# Patient Record
Sex: Female | Born: 1985 | Race: Black or African American | Hispanic: No | Marital: Married | Smoking: Never smoker
Health system: Southern US, Community
[De-identification: ages and names within clinical notes are randomized; demographics above are authoritative.]

## PROBLEM LIST (undated history)

## (undated) ENCOUNTER — Inpatient Hospital Stay (HOSPITAL_COMMUNITY): Payer: Self-pay

## (undated) DIAGNOSIS — D573 Sickle-cell trait: Secondary | ICD-10-CM

## (undated) HISTORY — PX: NO PAST SURGERIES: SHX2092

## (undated) HISTORY — PX: OVUM / OOCYTE RETRIEVAL: SUR1269

## (undated) HISTORY — DX: Sickle-cell trait: D57.3

---

## 2014-06-15 ENCOUNTER — Encounter (HOSPITAL_COMMUNITY): Payer: Self-pay | Admitting: *Deleted

## 2014-06-15 ENCOUNTER — Inpatient Hospital Stay (HOSPITAL_COMMUNITY)
Admission: AD | Admit: 2014-06-15 | Discharge: 2014-06-15 | Disposition: A | Discharge status: Home / Self Care | Payer: Self-pay | Source: Ambulatory Visit | Attending: Obstetrics and Gynecology | Admitting: Obstetrics and Gynecology

## 2014-06-15 DIAGNOSIS — R8271 Bacteriuria: Secondary | ICD-10-CM

## 2014-06-15 DIAGNOSIS — N390 Urinary tract infection, site not specified: Secondary | ICD-10-CM | POA: Insufficient documentation

## 2014-06-15 DIAGNOSIS — O99891 Other specified diseases and conditions complicating pregnancy: Secondary | ICD-10-CM | POA: Insufficient documentation

## 2014-06-15 DIAGNOSIS — O219 Vomiting of pregnancy, unspecified: Secondary | ICD-10-CM

## 2014-06-15 DIAGNOSIS — O9989 Other specified diseases and conditions complicating pregnancy, childbirth and the puerperium: Secondary | ICD-10-CM

## 2014-06-15 DIAGNOSIS — O2341 Unspecified infection of urinary tract in pregnancy, first trimester: Secondary | ICD-10-CM

## 2014-06-15 DIAGNOSIS — O210 Mild hyperemesis gravidarum: Secondary | ICD-10-CM | POA: Insufficient documentation

## 2014-06-15 DIAGNOSIS — O2390 Unspecified genitourinary tract infection in pregnancy, unspecified trimester: Secondary | ICD-10-CM | POA: Insufficient documentation

## 2014-06-15 LAB — URINALYSIS, ROUTINE W REFLEX MICROSCOPIC
BILIRUBIN URINE: NEGATIVE
Glucose, UA: NEGATIVE mg/dL
HGB URINE DIPSTICK: NEGATIVE
KETONES UR: NEGATIVE mg/dL
Nitrite: POSITIVE — AB
PH: 6 (ref 5.0–8.0)
Protein, ur: NEGATIVE mg/dL
SPECIFIC GRAVITY, URINE: 1.015 (ref 1.005–1.030)
Urobilinogen, UA: 0.2 mg/dL (ref 0.0–1.0)

## 2014-06-15 LAB — URINE MICROSCOPIC-ADD ON

## 2014-06-15 LAB — POCT PREGNANCY, URINE: Preg Test, Ur: POSITIVE — AB

## 2014-06-15 MED ORDER — PRENATAL VITAMINS PLUS 27-1 MG PO TABS: 1.0000 | ORAL_TABLET | ORAL | Status: AC

## 2014-06-15 MED ORDER — DOXYLAMINE SUCCINATE (SLEEP) 25 MG PO TABS: 25.0000 mg | ORAL_TABLET | Freq: Every evening | ORAL | Status: DC | PRN

## 2014-06-15 MED ORDER — PROMETHAZINE HCL 25 MG PO TABS
12.5000 mg | ORAL_TABLET | Freq: Once | ORAL | Status: AC
  Administered 2014-06-15: 12.5 mg via ORAL
  Filled 2014-06-15: qty 1

## 2014-06-15 MED ORDER — CEPHALEXIN 500 MG PO CAPS: 500.0000 mg | ORAL_CAPSULE | Freq: Four times a day (QID) | ORAL | Status: DC

## 2014-06-15 MED ORDER — PROMETHAZINE HCL 12.5 MG PO TABS: 12.5000 mg | ORAL_TABLET | Freq: Four times a day (QID) | ORAL | Status: DC | PRN

## 2014-06-15 NOTE — MAU Note (Signed)
Pt reports she is pregnant and has had N/V for sevral weeks unable to keep much downl

## 2014-06-15 NOTE — Discharge Instructions (Signed)
Morning Sickness Morning sickness is when you feel sick to your stomach (nauseous) during pregnancy. This nauseous feeling may or may not come with vomiting. It often occurs in the morning but can be a problem any time of day. Morning sickness is most common during the first trimester, but it may continue throughout pregnancy. While morning sickness is unpleasant, it is usually harmless unless you develop severe and continual vomiting (hyperemesis gravidarum). This condition requires more intense treatment.  CAUSES  The cause of morning sickness is not completely known but seems to be related to normal hormonal changes that occur in pregnancy. RISK FACTORS You are at greater risk if you:  Experienced nausea or vomiting before your pregnancy.  Had morning sickness during a previous pregnancy.  Are pregnant with more than one baby, such as twins. TREATMENT  Do not use any medicines (prescription, over-the-counter, or herbal) for morning sickness without first talking to your health care provider. Your health care provider may prescribe or recommend:  Vitamin B6 supplements.  Anti-nausea medicines.  The herbal medicine ginger. HOME CARE INSTRUCTIONS   Only take over-the-counter or prescription medicines as directed by your health care provider.  Taking multivitamins before getting pregnant can prevent or decrease the severity of morning sickness in most women.  Eat a piece of dry toast or unsalted crackers before getting out of bed in the morning.  Eat five or six small meals a day.  Eat dry and bland foods (rice, baked potato). Foods high in carbohydrates are often helpful.  Do not drink liquids with your meals. Drink liquids between meals.  Avoid greasy, fatty, and spicy foods.  Get someone to cook for you if the smell of any food causes nausea and vomiting.  If you feel nauseous after taking prenatal vitamins, take the vitamins at night or with a snack.  Snack on protein  foods (nuts, yogurt, cheese) between meals if you are hungry.  Eat unsweetened gelatins for desserts.  Wearing an acupressure wristband (worn for sea sickness) may be helpful.  Acupuncture may be helpful.  Do not smoke.  Get a humidifier to keep the air in your house free of odors.  Get plenty of fresh air. SEEK MEDICAL CARE IF:   Your home remedies are not working, and you need medicine.  You feel dizzy or lightheaded.  You are losing weight. SEEK IMMEDIATE MEDICAL CARE IF:   You have persistent and uncontrolled nausea and vomiting.  You pass out (faint). MAKE SURE YOU:  Understand these instructions.  Will watch your condition.  Will get help right away if you are not doing well or get worse. Document Released: 12/23/2006 Document Revised: 11/06/2013 Document Reviewed: 04/18/2013 [facility] Patient Information 2015 [place], [place]. This information is not intended to replace advice given to you by your health care provider. Make sure you discuss any questions you have with your health care provider.    ________________________________________     To schedule your Maternity Eligibility Appointment, please call [phone].  When you arrive for your appointment you must bring the following items or information listed below.  Your appointment will be rescheduled if you do not have these items or are 15 minutes late. If currently receiving Medicaid, you MUST bring: 1. Medicaid Card 2. Social Security Card 3. Picture ID 4. Proof of Pregnancy 5. Verification of current address if the address on Medicaid card is incorrect "postmarked mail" If not receiving Medicaid, you MUST bring: 1. Social Security Card 2. Picture ID 3. Birth Certificate (  if available) Passport or *Green Card 4. Proof of Pregnancy 5. Verification of current address "postmarked mail" for each income presented. 6. Verification of insurance coverage, if any 7. Check stubs from each employer for the  previous month (if unable to present check stub  for each week, we will accept check stub for the first and last week ill the same month.) If you can't locate check stubs, you must bring a letter from the employer(s) and it must have the following information on letterhead, typed, in English: o name of [place] telephone number o how long been with the company, if less than one month o how much person earns per hour o how many hours per week work o the gross pay the person earned for the previous month If you are 28 years old or less, you do not have to bring proof of income unless you work or live with the father of the baby and at that time we will need proof of income from you and/or the father of the baby. Green Card recipients are eligible for Medicaid for Pregnant Women (MPW)  Prenatal Care Providers [facility]  [phone]     Phone: [place]                      [facility]  @[place]     Phone: [phone]  Phone: [place]     Phone: [phone]  Phone: [facility] [place]                hone: [phone]  Phone: [phone]         [facility] [address]. Gracy Racer      Phone: [phone]  Phone: [phone]         [place] [facility]                Phone: [phone]  [place])          Phone: [phone] [facility]   Phone: [phone] Asymptomatic Bacteriuria Asymptomatic bacteriuria is the presence of a large number of bacteria in your urine without the usual symptoms of burning or frequent urination. The following conditions increase the risk of asymptomatic bacteriuria:  Diabetes mellitus.  Advanced age.  Pregnancy  in the first trimester.  Kidney stones.  Kidney transplants.  Leaky kidney tube valve in young children (reflux). Treatment for this condition is not needed in most people and can lead to other problems such as too much yeast and growth of resistant bacteria. However, some people, such as pregnant women, do need treatment to prevent kidney infection. Asymptomatic bacteriuria in pregnancy is also associated with fetal growth restriction, premature labor, and newborn death. HOME CARE INSTRUCTIONS Monitor your condition for any changes. The following actions may help to relieve any discomfort you are feeling:  Drink enough water and fluids to keep your urine clear or pale yellow. Go to the bathroom more often to keep your bladder empty.  Keep the area around your vagina and rectum clean. Wipe yourself from front to back after  urinating. SEEK IMMEDIATE MEDICAL CARE IF:  You develop signs of an infection such as:  Burning with urination.  Frequency of voiding.  Back pain.  Fever.  You have blood in the urine.  You develop a fever. MAKE SURE YOU:  Understand these instructions.  Will watch your condition.  Will get help right away if you are not doing well or get worse. Document Released: 11/01/2005 Document Revised: 03/18/2014 Document Reviewed: 04/23/2013 [facility] Patient Information 2015 [place], [place]. This information is not intended to replace advice given to you by your health care provider. Make sure you discuss any questions you have with your health care provider.

## 2014-06-15 NOTE — MAU Provider Note (Signed)
Chief Complaint: Morning Sickness   First Provider Initiated Contact with Patient 06/15/14 1549     SUBJECTIVE HPI: Casey Sullivan is a 28 y.o. G4P0030 at [redacted]w[redacted]d by sure LMP who presents with nausea and vomiting for the past 3-4 weeks. States she vomits every day usually about 2 hours after eating. Unsure of prepregnant weight. Not taking any antiemetics. No dysuria, hematuria, urgency. No irrititative vaginal discharge. No abdominal pain or vaginal bleeding. NPC.  Past Medical History  Diagnosis Date  . Medical history non-contributory    OB History  Gravida Para Term Preterm AB SAB TAB Ectopic Multiple Living  4    3         # Outcome Date GA Lbr Len/2nd Weight Sex Delivery Anes PTL Lv  4 CUR           3 ABT           2 ABT           1 ABT              Past Surgical History  Procedure Laterality Date  . No past surgeries    . Ovum / oocyte retrieval     History   Social History  . Marital Status: Single    Spouse Name: N/A    Number of Children: N/A  . Years of Education: N/A   Occupational History  . Not on file.   Social History Main Topics  . Smoking status: Never Smoker   . Smokeless tobacco: Not on file  . Alcohol Use: Not on file     Comment: occasioanl  . Drug Use: No  . Sexual Activity: Not on file   Other Topics Concern  . Not on file   Social History Narrative  . No narrative on file   No current facility-administered medications on file prior to encounter.   No current outpatient prescriptions on file prior to encounter.   No Known Allergies  ROS: Pertinent items in HPI  OBJECTIVE Blood pressure 130/80, pulse 107, temperature 98.5 F (36.9 C), temperature source Oral, resp. rate 18, height 5' 5.75" (1.67 m), last menstrual period 04/11/2014. GENERAL: Well-developed, well-nourished female in no acute distress.  HEENT: Normocephalic HEART: normal rate RESP: normal effort ABDOMEN: Soft, non-tender, uterus sl above SP BACK: neg  CVAT EXTREMITIES: Nontender, no edema NEURO: Alert and oriented  LAB RESULTS Results for orders placed during the hospital encounter of 06/15/14 (from the past 24 hour(s))  URINALYSIS, ROUTINE W REFLEX MICROSCOPIC     Status: Abnormal   Collection Time    06/15/14  3:25 PM      Result Value Ref Range   Color, Urine YELLOW  YELLOW   APPearance HAZY (*) CLEAR   Specific Gravity, Urine 1.015  1.005 - 1.030   pH 6.0  5.0 - 8.0   Glucose, UA NEGATIVE  NEGATIVE mg/dL   Hgb urine dipstick NEGATIVE  NEGATIVE   Bilirubin Urine NEGATIVE  NEGATIVE   Ketones, ur NEGATIVE  NEGATIVE mg/dL   Protein, ur NEGATIVE  NEGATIVE mg/dL   Urobilinogen, UA 0.2  0.0 - 1.0 mg/dL   Nitrite POSITIVE (*) NEGATIVE   Leukocytes, UA SMALL (*) NEGATIVE  URINE MICROSCOPIC-ADD ON     Status: Abnormal   Collection Time    06/15/14  3:25 PM      Result Value Ref Range   Squamous Epithelial / LPF FEW (*) RARE   WBC, UA 11-20  <3 WBC/hpf   RBC /  HPF 0-2  <3 RBC/hpf   Bacteria, UA MANY (*) RARE  POCT PREGNANCY, URINE     Status: Abnormal   Collection Time    06/15/14  3:27 PM      Result Value Ref Range   Preg Test, Ur POSITIVE (*) NEGATIVE    IMAGING No results found.  MAU COURSE Urine C&S sent  ASSESSMENT 1. Nausea and vomiting during pregnancy prior to [redacted] weeks gestation   2. UTI (urinary tract infection) during pregnancy, first trimester   Asymptomatic bacteruria G4P0030 at [redacted]w[redacted]d  PLAN Discharge home    Medication List         cephALEXin 500 MG capsule  Commonly known as:  KEFLEX  Take 1 capsule (500 mg total) by mouth 4 (four) times daily.     doxylamine (Sleep) 25 MG tablet  Commonly known as:  UNISOM  Take 1 tablet (25 mg total) by mouth at bedtime as needed.     PRENATAL VITAMINS PLUS 27-1 MG Tabs  Take 1 tablet by mouth 1 day or 1 dose.     promethazine 12.5 MG tablet  Commonly known as:  PHENERGAN  Take 1 tablet (12.5 mg total) by mouth every 6 (six) hours as needed for nausea or  vomiting.       Follow-up Information   Follow up with [facility] [place]. (Choose pregnancy care provider from the list given)    Contact information:   [place] [address] [place], CNM 06/15/2014  3:50 PM

## 2014-06-18 NOTE — MAU Provider Note (Signed)
Attestation of Attending Supervision of Advanced Practitioner: Evaluation and management procedures were performed by the PA/NP/CNM/OB Fellow under my supervision/collaboration. Chart reviewed and agree with management and plan.  Venda Dice V 06/18/2014 7:13 AM

## 2014-07-27 ENCOUNTER — Inpatient Hospital Stay (HOSPITAL_COMMUNITY): Payer: Medicaid Other

## 2014-07-27 ENCOUNTER — Encounter (HOSPITAL_COMMUNITY): Payer: Self-pay | Admitting: *Deleted

## 2014-07-27 ENCOUNTER — Inpatient Hospital Stay (HOSPITAL_COMMUNITY)
Admission: AD | Admit: 2014-07-27 | Discharge: 2014-07-27 | Disposition: A | Discharge status: Home / Self Care | Payer: Medicaid Other | Source: Ambulatory Visit | Attending: Obstetrics & Gynecology | Admitting: Obstetrics & Gynecology

## 2014-07-27 DIAGNOSIS — N390 Urinary tract infection, site not specified: Secondary | ICD-10-CM | POA: Insufficient documentation

## 2014-07-27 DIAGNOSIS — O2390 Unspecified genitourinary tract infection in pregnancy, unspecified trimester: Secondary | ICD-10-CM | POA: Insufficient documentation

## 2014-07-27 DIAGNOSIS — O4402 Placenta previa specified as without hemorrhage, second trimester: Secondary | ICD-10-CM

## 2014-07-27 DIAGNOSIS — R109 Unspecified abdominal pain: Secondary | ICD-10-CM | POA: Insufficient documentation

## 2014-07-27 DIAGNOSIS — O209 Hemorrhage in early pregnancy, unspecified: Secondary | ICD-10-CM | POA: Insufficient documentation

## 2014-07-27 DIAGNOSIS — O4400 Placenta previa specified as without hemorrhage, unspecified trimester: Secondary | ICD-10-CM | POA: Insufficient documentation

## 2014-07-27 LAB — CBC
HCT: 31 % — ABNORMAL LOW (ref 36.0–46.0)
HEMOGLOBIN: 11 g/dL — AB (ref 12.0–15.0)
MCH: 26.8 pg (ref 26.0–34.0)
MCHC: 35.5 g/dL (ref 30.0–36.0)
MCV: 75.4 fL — ABNORMAL LOW (ref 78.0–100.0)
PLATELETS: 210 10*3/uL (ref 150–400)
RBC: 4.11 MIL/uL (ref 3.87–5.11)
RDW: 13.4 % (ref 11.5–15.5)
WBC: 5.2 10*3/uL (ref 4.0–10.5)

## 2014-07-27 LAB — URINALYSIS, ROUTINE W REFLEX MICROSCOPIC
Bilirubin Urine: NEGATIVE
Glucose, UA: NEGATIVE mg/dL
Hgb urine dipstick: NEGATIVE
Ketones, ur: NEGATIVE mg/dL
LEUKOCYTES UA: NEGATIVE
NITRITE: NEGATIVE
PROTEIN: NEGATIVE mg/dL
Specific Gravity, Urine: 1.01 (ref 1.005–1.030)
Urobilinogen, UA: 0.2 mg/dL (ref 0.0–1.0)
pH: 6.5 (ref 5.0–8.0)

## 2014-07-27 LAB — OB RESULTS CONSOLE ANTIBODY SCREEN: Antibody Screen: NEGATIVE

## 2014-07-27 LAB — ABO/RH: ABO/RH(D): B POS

## 2014-07-27 MED ORDER — NITROFURANTOIN MONOHYD MACRO 100 MG PO CAPS: 100.0000 mg | ORAL_CAPSULE | Freq: Two times a day (BID) | ORAL | Status: DC

## 2014-07-27 NOTE — MAU Provider Note (Signed)
History     CSN: [id number]  Arrival date and time: 07/27/14 1123   None     Chief Complaint  Patient presents with  . Vaginal Bleeding   HPI  Ms.Casey Sullivan is a 28 y.o. female G4P0030 at [redacted]w[redacted]d who presents with new onset dark red vaginal bleeding along with bilateral lower abdominal pain. The patient denies recent intercourse.She is scheduled for her first prenatal visit on Sept 17th; pt is unsure what the name of the practice/dr is. The patient desribes the bleeding as light, and she did not need to wear a pad.   OB History   Grav Para Term Preterm Abortions TAB SAB Ect Mult Living   4    3           Past Medical History  Diagnosis Date  . Medical history non-contributory     Past Surgical History  Procedure Laterality Date  . No past surgeries    . Ovum / oocyte retrieval      History reviewed. No pertinent family history.  History  Substance Use Topics  . Smoking status: Never Smoker   . Smokeless tobacco: Never Used  . Alcohol Use: Yes     Comment: occasioanl    Allergies: No Known Allergies  Prescriptions prior to admission  Medication Sig Dispense Refill  . Prenatal Vit-Fe Fumarate-FA (PRENATAL VITAMINS PLUS) 27-1 MG TABS Take 1 tablet by mouth 1 day or 1 dose.  30 tablet  3   Results for orders placed during the hospital encounter of 07/27/14 (from the past 48 hour(s))  URINALYSIS, ROUTINE W REFLEX MICROSCOPIC     Status: None   Collection Time    07/27/14 11:35 AM      Result Value Ref Range   Color, Urine YELLOW  YELLOW   APPearance CLEAR  CLEAR   Specific Gravity, Urine 1.010  1.005 - 1.030   pH 6.5  5.0 - 8.0   Glucose, UA NEGATIVE  NEGATIVE mg/dL   Hgb urine dipstick NEGATIVE  NEGATIVE   Bilirubin Urine NEGATIVE  NEGATIVE   Ketones, ur NEGATIVE  NEGATIVE mg/dL   Protein, ur NEGATIVE  NEGATIVE mg/dL   Urobilinogen, UA 0.2  0.0 - 1.0 mg/dL   Nitrite NEGATIVE  NEGATIVE   Leukocytes, UA NEGATIVE  NEGATIVE   Comment: MICROSCOPIC NOT  DONE ON URINES WITH NEGATIVE PROTEIN, BLOOD, LEUKOCYTES, NITRITE, OR GLUCOSE <1000 mg/dL.  ABO/RH     Status: None   Collection Time    07/27/14  1:30 PM      Result Value Ref Range   ABO/RH(D) B POS    CBC     Status: Abnormal   Collection Time    07/27/14  1:30 PM      Result Value Ref Range   WBC 5.2  4.0 - 10.5 K/uL   RBC 4.11  3.87 - 5.11 MIL/uL   Hemoglobin 11.0 (*) 12.0 - 15.0 g/dL   HCT 31.0 (*) 36.0 - 46.0 %   MCV 75.4 (*) 78.0 - 100.0 fL   MCH 26.8  26.0 - 34.0 pg   MCHC 35.5  30.0 - 36.0 g/dL   RDW 13.4  11.5 - 15.5 %   Platelets 210  150 - 400 K/uL    Review of Systems  Constitutional: Negative for fever and chills.  Gastrointestinal: Positive for abdominal pain (Bilateral lower abdominal cramping ). Negative for nausea and vomiting.  Genitourinary:       No vaginal discharge. + vaginal bleeding.  No dysuria.    Physical Exam   Blood pressure 115/74, pulse 82, temperature 98.3 F (36.8 C), temperature source Oral, resp. rate 16, height 5\' 5"  (1.651 m), weight 65.375 kg (144 lb 2 oz), last menstrual period 04/11/2014.  Physical Exam  Constitutional: She is oriented to person, place, and time. She appears well-developed and well-nourished. No distress.  HENT:  Head: Normocephalic.  Eyes: Pupils are equal, round, and reactive to light.  Neck: Neck supple.  Respiratory: Effort normal.  GI: Soft. She exhibits no distension. There is no tenderness. There is no rebound and no guarding.  Genitourinary:  Speculum exam: Vagina - Small amount of thick, dark brown discharge Cervix - No contact bleeding, no active bleeding  Chaperone present for exam.   Musculoskeletal: Normal range of motion.  Neurological: She is alert and oriented to person, place, and time.  Skin: Skin is warm. She is not diaphoretic.  Psychiatric: Her behavior is normal.    MAU Course  Procedures None  MDM Unable to doppler fetal heart tones by RN Patient to [place] to evaluate placenta   Pelvic exam  B positive blood type  Assessment and Plan   Assessment: UTI Vaginal bleeding in second trimester Posterior placenta previa   Plan: Discharge home in stable condition RX: Macrobid Return to MAU if bleeding worsens Bleeding precautions Pelvic rest   [place], NP  07/27/2014, 1:18 PM

## 2014-07-27 NOTE — MAU Provider Note (Signed)
Attestation of Attending Supervision of Advanced Practitioner (PA/CNM/NP): Evaluation and management procedures were performed by the Advanced Practitioner under my supervision and collaboration.  I have reviewed the Advanced Practitioner's note and chart, and I agree with the management and plan.  UGONNA  ANYANWU, MD, FACOG Attending Obstetrician & Gynecologist Faculty Practice, [facility]   

## 2014-07-27 NOTE — MAU Note (Signed)
Patient presents to MAU with complaint of vaginal bleeding at 15 weeks.

## 2014-07-30 LAB — CULTURE, OB URINE
Colony Count: 30000
SPECIAL REQUESTS: NORMAL

## 2014-08-05 ENCOUNTER — Other Ambulatory Visit (HOSPITAL_COMMUNITY): Payer: Self-pay | Admitting: Physician Assistant

## 2014-08-05 DIAGNOSIS — Z3689 Encounter for other specified antenatal screening: Secondary | ICD-10-CM

## 2014-08-05 LAB — OB RESULTS CONSOLE GC/CHLAMYDIA
Chlamydia: NEGATIVE
Gonorrhea: NEGATIVE

## 2014-08-05 LAB — OB RESULTS CONSOLE GBS: STREP GROUP B AG: POSITIVE

## 2014-08-05 LAB — OB RESULTS CONSOLE HIV ANTIBODY (ROUTINE TESTING): HIV: NONREACTIVE

## 2014-08-05 LAB — OB RESULTS CONSOLE HEPATITIS B SURFACE ANTIGEN: Hepatitis B Surface Ag: NEGATIVE

## 2014-08-05 LAB — OB RESULTS CONSOLE RPR: RPR: NONREACTIVE

## 2014-08-05 LAB — OB RESULTS CONSOLE RUBELLA ANTIBODY, IGM: Rubella: IMMUNE

## 2014-09-02 ENCOUNTER — Ambulatory Visit (HOSPITAL_COMMUNITY)
Admission: RE | Admit: 2014-09-02 | Discharge: 2014-09-02 | Disposition: A | Discharge status: Home / Self Care | Payer: Medicaid Other | Source: Ambulatory Visit | Attending: Physician Assistant | Admitting: Physician Assistant

## 2014-09-02 ENCOUNTER — Other Ambulatory Visit (HOSPITAL_COMMUNITY): Payer: Self-pay

## 2014-09-02 DIAGNOSIS — Z3A20 20 weeks gestation of pregnancy: Secondary | ICD-10-CM | POA: Diagnosis not present

## 2014-09-02 DIAGNOSIS — Z36 Encounter for antenatal screening of mother: Secondary | ICD-10-CM | POA: Insufficient documentation

## 2014-09-02 DIAGNOSIS — Z3689 Encounter for other specified antenatal screening: Secondary | ICD-10-CM

## 2014-09-02 DIAGNOSIS — O4402 Placenta previa specified as without hemorrhage, second trimester: Secondary | ICD-10-CM

## 2014-09-04 ENCOUNTER — Other Ambulatory Visit (HOSPITAL_COMMUNITY): Payer: Self-pay | Admitting: Physician Assistant

## 2014-09-04 DIAGNOSIS — O4402 Placenta previa specified as without hemorrhage, second trimester: Secondary | ICD-10-CM

## 2014-09-17 ENCOUNTER — Encounter (HOSPITAL_COMMUNITY): Payer: Self-pay | Admitting: *Deleted

## 2014-10-24 ENCOUNTER — Ambulatory Visit (HOSPITAL_COMMUNITY): Admission: RE | Admit: 2014-10-24 | Payer: Medicaid Other | Source: Ambulatory Visit

## 2014-11-15 NOTE — L&D Delivery Note (Signed)
Patient is 29 y.o. G4P0030 [redacted]w[redacted]d admitted in active labor.   Delivery Note At 9:37 PM a healthy female was delivered via  (Presentation: ; Occiput Anterior).  APGAR: 8,8 ; weight: pending .   Placenta status: Intact, Spontaneous.  Cord:  with the following complications: None.    Anesthesia: Epidural  Episiotomy:  None Lacerations:  None Suture Repair: N/A Est. Blood Loss (mL):  300  Mom to postpartum.  Baby to Couplet care / Skin to Skin.  Lorna Few 01/17/2015, 9:59 PM  I was gloved and present for the entire delivery/inspection and I agree with the above. Serita Grammes CNM 11:29 PM 01/17/2015

## 2014-11-27 ENCOUNTER — Other Ambulatory Visit (HOSPITAL_COMMUNITY): Payer: Self-pay | Admitting: Physician Assistant

## 2014-11-27 DIAGNOSIS — Z0372 Encounter for suspected placental problem ruled out: Secondary | ICD-10-CM

## 2014-11-28 ENCOUNTER — Ambulatory Visit (HOSPITAL_COMMUNITY)
Admission: RE | Admit: 2014-11-28 | Discharge: 2014-11-28 | Disposition: A | Discharge status: Home / Self Care | Payer: Medicaid Other | Source: Ambulatory Visit | Attending: Physician Assistant | Admitting: Physician Assistant

## 2014-11-28 DIAGNOSIS — Z3A32 32 weeks gestation of pregnancy: Secondary | ICD-10-CM | POA: Insufficient documentation

## 2014-11-28 DIAGNOSIS — Z0372 Encounter for suspected placental problem ruled out: Secondary | ICD-10-CM

## 2014-11-28 DIAGNOSIS — O4403 Placenta previa specified as without hemorrhage, third trimester: Secondary | ICD-10-CM | POA: Insufficient documentation

## 2014-11-29 ENCOUNTER — Other Ambulatory Visit (HOSPITAL_COMMUNITY): Payer: Self-pay | Admitting: Nurse Practitioner

## 2014-11-29 DIAGNOSIS — O4410 Placenta previa with hemorrhage, unspecified trimester: Secondary | ICD-10-CM

## 2014-12-19 ENCOUNTER — Ambulatory Visit (HOSPITAL_COMMUNITY)
Admission: RE | Admit: 2014-12-19 | Discharge: 2014-12-19 | Disposition: A | Discharge status: Home / Self Care | Payer: Medicaid Other | Source: Ambulatory Visit | Attending: Nurse Practitioner | Admitting: Nurse Practitioner

## 2014-12-19 DIAGNOSIS — O4440 Low lying placenta NOS or without hemorrhage, unspecified trimester: Secondary | ICD-10-CM | POA: Insufficient documentation

## 2014-12-19 DIAGNOSIS — Z3A35 35 weeks gestation of pregnancy: Secondary | ICD-10-CM | POA: Insufficient documentation

## 2014-12-19 DIAGNOSIS — O4410 Placenta previa with hemorrhage, unspecified trimester: Secondary | ICD-10-CM | POA: Insufficient documentation

## 2015-01-16 ENCOUNTER — Other Ambulatory Visit (HOSPITAL_COMMUNITY): Payer: Self-pay | Admitting: Nurse Practitioner

## 2015-01-16 ENCOUNTER — Encounter (HOSPITAL_COMMUNITY): Payer: Self-pay | Admitting: *Deleted

## 2015-01-16 ENCOUNTER — Inpatient Hospital Stay (HOSPITAL_COMMUNITY)
Admission: AD | Admit: 2015-01-16 | Discharge: 2015-01-16 | Disposition: A | Discharge status: Home / Self Care | Payer: Medicaid Other | Source: Ambulatory Visit | Attending: Obstetrics & Gynecology | Admitting: Obstetrics & Gynecology

## 2015-01-16 ENCOUNTER — Inpatient Hospital Stay (HOSPITAL_COMMUNITY)
Admission: AD | Admit: 2015-01-16 | Discharge: 2015-01-19 | DRG: 774 | Disposition: A | Discharge status: Home / Self Care | Payer: Medicaid Other | Source: Ambulatory Visit | Attending: Obstetrics & Gynecology | Admitting: Obstetrics & Gynecology

## 2015-01-16 DIAGNOSIS — IMO0001 Reserved for inherently not codable concepts without codable children: Secondary | ICD-10-CM

## 2015-01-16 DIAGNOSIS — O26893 Other specified pregnancy related conditions, third trimester: Secondary | ICD-10-CM | POA: Insufficient documentation

## 2015-01-16 DIAGNOSIS — R109 Unspecified abdominal pain: Secondary | ICD-10-CM | POA: Insufficient documentation

## 2015-01-16 DIAGNOSIS — O480 Post-term pregnancy: Secondary | ICD-10-CM | POA: Diagnosis present

## 2015-01-16 DIAGNOSIS — O99824 Streptococcus B carrier state complicating childbirth: Secondary | ICD-10-CM | POA: Diagnosis present

## 2015-01-16 DIAGNOSIS — Z3A40 40 weeks gestation of pregnancy: Secondary | ICD-10-CM | POA: Diagnosis present

## 2015-01-16 DIAGNOSIS — Z23 Encounter for immunization: Secondary | ICD-10-CM

## 2015-01-16 DIAGNOSIS — O4403 Placenta previa specified as without hemorrhage, third trimester: Secondary | ICD-10-CM | POA: Diagnosis present

## 2015-01-16 LAB — COMPREHENSIVE METABOLIC PANEL
ALBUMIN: 3.2 g/dL — AB (ref 3.5–5.2)
ALT: 12 U/L (ref 0–35)
ANION GAP: 10 (ref 5–15)
AST: 17 U/L (ref 0–37)
Alkaline Phosphatase: 144 U/L — ABNORMAL HIGH (ref 39–117)
BUN: 9 mg/dL (ref 6–23)
CALCIUM: 9.7 mg/dL (ref 8.4–10.5)
CO2: 23 mmol/L (ref 19–32)
CREATININE: 0.54 mg/dL (ref 0.50–1.10)
Chloride: 102 mmol/L (ref 96–112)
GFR calc Af Amer: >90 mL/min (ref >90)
GFR calc non Af Amer: >90 mL/min (ref >90)
GLUCOSE: 88 mg/dL (ref 70–99)
POTASSIUM: 3.7 mmol/L (ref 3.5–5.1)
Sodium: 135 mmol/L (ref 135–145)
TOTAL PROTEIN: 7.1 g/dL (ref 6.0–8.3)
Total Bilirubin: 0.5 mg/dL (ref 0.3–1.2)

## 2015-01-16 LAB — CBC
HCT: 34.1 % — ABNORMAL LOW (ref 36.0–46.0)
HEMOGLOBIN: 12 g/dL (ref 12.0–15.0)
MCH: 28.5 pg (ref 26.0–34.0)
MCHC: 35.2 g/dL (ref 30.0–36.0)
MCV: 81 fL (ref 78.0–100.0)
Platelets: 239 10*3/uL (ref 150–400)
RBC: 4.21 MIL/uL (ref 3.87–5.11)
RDW: 14.1 % (ref 11.5–15.5)
WBC: 7.9 10*3/uL (ref 4.0–10.5)

## 2015-01-16 LAB — PROTEIN / CREATININE RATIO, URINE
Creatinine, Urine: 60 mg/dL
Protein Creatinine Ratio: 0.27 — ABNORMAL HIGH (ref 0.00–0.15)
Total Protein, Urine: 16 mg/dL

## 2015-01-16 LAB — URIC ACID: URIC ACID, SERUM: 4.4 mg/dL (ref 2.4–7.0)

## 2015-01-16 LAB — LACTATE DEHYDROGENASE: LDH: 130 U/L (ref 94–250)

## 2015-01-16 MED ORDER — OXYCODONE-ACETAMINOPHEN 5-325 MG PO TABS
1.0000 | ORAL_TABLET | Freq: Once | ORAL | Status: AC
  Administered 2015-01-16: 1 via ORAL
  Filled 2015-01-16: qty 1

## 2015-01-16 MED ORDER — LACTATED RINGERS IV SOLN
INTRAVENOUS | Status: DC
  Administered 2015-01-16 – 2015-01-17 (×2): via INTRAVENOUS

## 2015-01-16 MED ORDER — OXYCODONE-ACETAMINOPHEN 5-325 MG PO TABS: 1.0000 | ORAL_TABLET | Freq: Four times a day (QID) | ORAL | Status: DC | PRN

## 2015-01-16 NOTE — Discharge Instructions (Signed)
Braxton Hicks Contractions °Contractions of the uterus can occur throughout pregnancy. Contractions are not always a sign that you are in labor.  °WHAT ARE BRAXTON HICKS CONTRACTIONS?  °Contractions that occur before labor are called Braxton Hicks contractions, or false labor. Toward the end of pregnancy (32-34 weeks), these contractions can develop more often and may become more forceful. This is not true labor because these contractions do not result in opening (dilatation) and thinning of the cervix. They are sometimes difficult to tell apart from true labor because these contractions can be forceful and people have different pain tolerances. You should not feel embarrassed if you go to the hospital with false labor. Sometimes, the only way to tell if you are in true labor is for your health care provider to look for changes in the cervix. °If there are no prenatal problems or other health problems associated with the pregnancy, it is completely safe to be sent home with false labor and await the onset of true labor. °HOW CAN YOU TELL THE DIFFERENCE BETWEEN TRUE AND FALSE LABOR? °False Labor °· The contractions of false labor are usually shorter and not as hard as those of true labor.   °· The contractions are usually irregular.   °· The contractions are often felt in the front of the lower abdomen and in the groin.   °· The contractions may go away when you walk around or change positions while lying down.   °· The contractions get weaker and are shorter lasting as time goes on.   °· The contractions do not usually become progressively stronger, regular, and closer together as with true labor.   °True Labor °· Contractions in true labor last 30-70 seconds, become very regular, usually become more intense, and increase in frequency.   °· The contractions do not go away with walking.   °· The discomfort is usually felt in the top of the uterus and spreads to the lower abdomen and low back.   °· True labor can be  determined by your health care provider with an exam. This will show that the cervix is dilating and getting thinner.   °WHAT TO REMEMBER °· Keep up with your usual exercises and follow other instructions given by your health care provider.   °· Take medicines as directed by your health care provider.   °· Keep your regular prenatal appointments.   °· Eat and drink lightly if you think you are going into labor.   °· If Braxton Hicks contractions are making you uncomfortable:   °¨ Change your position from lying down or resting to walking, or from walking to resting.   °¨ Sit and rest in a tub of warm water.   °¨ Drink 2-3 glasses of water. Dehydration may cause these contractions.   °¨ Do slow and deep breathing several times an hour.   °WHEN SHOULD I SEEK IMMEDIATE MEDICAL CARE? °Seek immediate medical care if: °· Your contractions become stronger, more regular, and closer together.   °· You have fluid leaking or gushing from your vagina.   °· You have a fever.   °· You pass blood-tinged mucus.   °· You have vaginal bleeding.   °· You have continuous abdominal pain.   °· You have low back pain that you never had before.   °· You feel your baby's head pushing down and causing pelvic pressure.   °· Your baby is not moving as much as it used to.   °Document Released: 11/01/2005 Document Revised: 11/06/2013 Document Reviewed: 08/13/2013 °Exit[employer]® Patient Information ©2015 [employer]. This information is not intended to replace advice given to you by your health care   provider. Make sure you discuss any questions you have with your health care provider. ° °

## 2015-01-16 NOTE — MAU Provider Note (Signed)
  History     CSN: [id number]  Arrival date and time: 01/16/15 0056  Patient Contact at 3:15am on 01/16/15    Chief Complaint  Patient presents with  . Labor Eval   HPI Mrs. Graul is a 29yo female G4P0030 at [place] presenting for cramping since 1800 on 01/15/15 reporting to have contractions every 5 minutes. Pain located in groin. Denies vaginal bleeding or discharge. Denies LOF. Continues to note fetal movement. Denies edema, headache, chest pain, or blurred vision. Has follow up appointment at HD at 2pm today. OB History    Gravida Para Term Preterm AB TAB SAB Ectopic Multiple Living   4    3           Past Medical History  Diagnosis Date  . Medical history non-contributory     Past Surgical History  Procedure Laterality Date  . No past surgeries    . Ovum / oocyte retrieval      History reviewed. No pertinent family history.  History  Substance Use Topics  . Smoking status: Never Smoker   . Smokeless tobacco: Never Used  . Alcohol Use: Yes     Comment: occasioanl    Allergies: No Known Allergies  Prescriptions prior to admission  Medication Sig Dispense Refill Last Dose  . Prenatal Vit-Fe Fumarate-FA (PRENATAL VITAMINS PLUS) 27-1 MG TABS Take 1 tablet by mouth 1 day or 1 dose. 30 tablet 3 01/15/2015 at Unknown time  . nitrofurantoin, macrocrystal-monohydrate, (MACROBID) 100 MG capsule Take 1 capsule (100 mg total) by mouth 2 (two) times daily. 10 capsule 0     Review of Systems  Eyes: Negative for blurred vision.  Cardiovascular: Negative for chest pain.   Physical Exam   Blood pressure 123/82, pulse 87, temperature 98.1 F (36.7 C), temperature source Oral, resp. rate 20, height 5\' 4"  (1.626 m), weight 86.24 kg (190 lb 2 oz), last menstrual period 04/11/2014, SpO2 100 %.  Physical Exam  Constitutional: She appears well-developed and well-nourished.  Moderate distress secondary to contractions  Cardiovascular: Normal rate and regular rhythm.  Exam reveals no  gallop and no friction rub.   No murmur heard. Respiratory: Effort normal. No respiratory distress. She has no wheezes. She has no rales.  GI: Soft. There is no tenderness.  Musculoskeletal: She exhibits no edema.  Pain over round ligament bilaterally  Psychiatric: She has a normal mood and affect. Her behavior is normal.  Dilation: 2 Effacement (%): 80 Station: -2 Presentation: Vertex Exam by:: [place],RN  MAU Course  Procedures  MDM CMP: normal CBC: normal Protein-Creatinine Ratio: 0.27 Fetal Monitor: baseline 140bpm, moderate variability, + accelerations, no decelerations  Assessment and Plan  Cervical exam unchanged over 1 hour. Suspect contractions are [employer]. BP initially elevated at 135/89. Max noted 149/90, trended back down to 123/82 prior to discharge. Protein-Creatinine Ratio mildly elevated at 0.27. Percocet #15 given for pain PRN Stable for discharge. Follow up at HD at 2pm today for recheck of cervical exam and recheck of BP.  Lorna Few 01/16/2015, 3:12 AM   I have participated in the care of this patient and I agree with the above. Serita Grammes CNM 11:30 PM 01/17/2015

## 2015-01-16 NOTE — H&P (Signed)
Casey Sullivan is a 29 y.o. female G4P0030 with IUP at [redacted]w[redacted]d presenting for contractions. She was seen early this am, contracting irregularly without cervical change. .Since then, her contractions have gradually gotten stronger and more regular.   Membranes are intact, with active fetal movement.   PNCare at [facility] since 16 wks  Prenatal History/Complications:  Late PNC GBS + TAB X 3   Past Medical History: Past Medical History  Diagnosis Date  . Medical history non-contributory     Past Surgical History: Past Surgical History  Procedure Laterality Date  . No past surgeries    . Ovum / oocyte retrieval      Obstetrical History: OB History    Gravida Para Term Preterm AB TAB SAB Ectopic Multiple Living   4    3            Social History: History   Social History  . Marital Status: Single    Spouse Name: N/A  . Number of Children: N/A  . Years of Education: N/A   Social History Main Topics  . Smoking status: Never Smoker   . Smokeless tobacco: Never Used  . Alcohol Use: Yes     Comment: occasioanl  . Drug Use: No  . Sexual Activity: Yes    Birth Control/ Protection: None   Other Topics Concern  . Not on file   Social History Narrative    Family History: No family history on file.  Allergies: No Known Allergies  Prescriptions prior to admission  Medication Sig Dispense Refill Last Dose  . nitrofurantoin, macrocrystal-monohydrate, (MACROBID) 100 MG capsule Take 1 capsule (100 mg total) by mouth 2 (two) times daily. 10 capsule 0   . oxyCODONE-acetaminophen (PERCOCET/ROXICET) 5-325 MG per tablet Take 1 tablet by mouth every 6 (six) hours as needed for severe pain. 5 tablet 0   . Prenatal Vit-Fe Fumarate-FA (PRENATAL VITAMINS PLUS) 27-1 MG TABS Take 1 tablet by mouth 1 day or 1 dose. 30 tablet 3 01/15/2015 at Unknown time     Prenatal Transfer Tool  Maternal Diabetes: No Genetic Screening: Normal Maternal Ultrasounds/Referrals: Normal Fetal Ultrasounds  or other Referrals:  None Maternal Substance Abuse:  No Significant Maternal Medications:  None Significant Maternal Lab Results: Lab values include: Group B Strep positive     Review of Systems   Constitutional: Negative for fever and chills Eyes: Negative for visual disturbances Respiratory: Negative for shortness of breath, dyspnea Cardiovascular: Negative for chest pain or palpitations  Gastrointestinal: Negative for vomiting, diarrhea and constipation.  POSITIVE for abdominal pain (contractions) Genitourinary: Negative for dysuria and urgency Musculoskeletal: Negative for back pain, joint pain, myalgias  Neurological: Negative for dizziness and headaches      Blood pressure 124/82, pulse 114, temperature 99.7 F (37.6 C), temperature source Oral, resp. rate 24, last menstrual period 04/11/2014, SpO2 99 %. General appearance: alert, cooperative and mild distress Lungs: clear to auscultation bilaterally Heart: regular rate and rhythm Abdomen: soft, non-tender; bowel sounds normal Extremities: Homans sign is negative, no sign of DVT DTR's 2+ Presentation: cephalic Fetal monitoring  Baseline: 145 bpm, Variability: Good {> 6 bpm), Accelerations: Reactive and Decelerations: Absent Uterine activity  q 2-3 minutes  Dilation: 7 Effacement (%): 90 Station: -2 Exam by:: Noel Gerold RN   Prenatal labs: ABO, Rh: --/--/B POS (09/12 1330) Antibody:  neg Rubella:  imm RPR:   neg HBsAg:   neg HIV:   neg GBS:   pos 1 hr Glucola 126 Genetic screening  Normal AFP  Anatomy US normal   Results for orders placed or performed during the hospital encounter of 01/16/15 (from the past 24 hour(s))  Protein / creatinine ratio, urine   Collection Time: 01/16/15  1:12 AM  Result Value Ref Range   Creatinine, Urine 60.00 mg/dL   Total Protein, Urine 16 mg/dL   Protein Creatinine Ratio 0.27 (H) 0.00 - 0.15  Comprehensive metabolic panel   Collection Time: 01/16/15  2:24 AM  Result  Value Ref Range   Sodium 135 135 - 145 mmol/L   Potassium 3.7 3.5 - 5.1 mmol/L   Chloride 102 96 - 112 mmol/L   CO2 23 19 - 32 mmol/L   Glucose, Bld 88 70 - 99 mg/dL   BUN 9 6 - 23 mg/dL   Creatinine, Ser 0.54 0.50 - 1.10 mg/dL   Calcium 9.7 8.4 - 10.5 mg/dL   Total Protein 7.1 6.0 - 8.3 g/dL   Albumin 3.2 (L) 3.5 - 5.2 g/dL   AST 17 0 - 37 U/L   ALT 12 0 - 35 U/L   Alkaline Phosphatase 144 (H) 39 - 117 U/L   Total Bilirubin 0.5 0.3 - 1.2 mg/dL   GFR calc non Af Amer >90 >90 mL/min   GFR calc Af Amer >90 >90 mL/min   Anion gap 10 5 - 15  CBC   Collection Time: 01/16/15  2:24 AM  Result Value Ref Range   WBC 7.9 4.0 - 10.5 K/uL   RBC 4.21 3.87 - 5.11 MIL/uL   Hemoglobin 12.0 12.0 - 15.0 g/dL   HCT 34.1 (L) 36.0 - 46.0 %   MCV 81.0 78.0 - 100.0 fL   MCH 28.5 26.0 - 34.0 pg   MCHC 35.2 30.0 - 36.0 g/dL   RDW 14.1 11.5 - 15.5 %   Platelets 239 150 - 400 K/uL  Uric acid   Collection Time: 01/16/15  2:24 AM  Result Value Ref Range   Uric Acid, Serum 4.4 2.4 - 7.0 mg/dL  Lactate dehydrogenase   Collection Time: 01/16/15  2:24 AM  Result Value Ref Range   LDH 130 94 - 250 U/L    Assessment: Casey Sullivan is a 29 y.o. G4P0030 with an IUP at [redacted]w[redacted]d presenting for active labor; GBS +  Plan: #Labor: expectant management #Pain:  epidural #FWB Cat1 #ID: GBS: +.  Rx ampicillin d/t advanced cx dilation  #MOF:  br/bo #MOC: unsure #Circ: no   CRESENZO-DISHMAN,Vamsi Apfel 01/16/2015, 11:45 PM

## 2015-01-16 NOTE — MAU Note (Signed)
Pt reports cramping since 1800, now having contractions q 5 minutes. Denies bleeding or ROM

## 2015-01-17 ENCOUNTER — Inpatient Hospital Stay (HOSPITAL_COMMUNITY): Payer: Medicaid Other | Admitting: Anesthesiology

## 2015-01-17 ENCOUNTER — Encounter (HOSPITAL_COMMUNITY): Payer: Self-pay | Admitting: *Deleted

## 2015-01-17 DIAGNOSIS — IMO0001 Reserved for inherently not codable concepts without codable children: Secondary | ICD-10-CM

## 2015-01-17 DIAGNOSIS — O99824 Streptococcus B carrier state complicating childbirth: Secondary | ICD-10-CM

## 2015-01-17 DIAGNOSIS — O4403 Placenta previa specified as without hemorrhage, third trimester: Secondary | ICD-10-CM

## 2015-01-17 DIAGNOSIS — O480 Post-term pregnancy: Secondary | ICD-10-CM

## 2015-01-17 LAB — CBC
HEMATOCRIT: 35.5 % — AB (ref 36.0–46.0)
Hemoglobin: 12.3 g/dL (ref 12.0–15.0)
MCH: 27.8 pg (ref 26.0–34.0)
MCHC: 34.6 g/dL (ref 30.0–36.0)
MCV: 80.1 fL (ref 78.0–100.0)
Platelets: 261 10*3/uL (ref 150–400)
RBC: 4.43 MIL/uL (ref 3.87–5.11)
RDW: 14.1 % (ref 11.5–15.5)
WBC: 13.8 10*3/uL — ABNORMAL HIGH (ref 4.0–10.5)

## 2015-01-17 LAB — TYPE AND SCREEN
ABO/RH(D): B POS
Antibody Screen: NEGATIVE

## 2015-01-17 MED ORDER — WITCH HAZEL-GLYCERIN EX PADS: 1.0000 "application " | MEDICATED_PAD | CUTANEOUS | Status: DC | PRN

## 2015-01-17 MED ORDER — EPHEDRINE 5 MG/ML INJ
10.0000 mg | INTRAVENOUS | Status: DC | PRN
  Filled 2015-01-17: qty 2

## 2015-01-17 MED ORDER — ZOLPIDEM TARTRATE 5 MG PO TABS: 5.0000 mg | ORAL_TABLET | Freq: Every evening | ORAL | Status: DC | PRN

## 2015-01-17 MED ORDER — TETANUS-DIPHTH-ACELL PERTUSSIS 5-2.5-18.5 LF-MCG/0.5 IM SUSP
0.5000 mL | Freq: Once | INTRAMUSCULAR | Status: AC
Start: 2015-01-18 — End: 2015-01-19
  Administered 2015-01-19: 0.5 mL via INTRAMUSCULAR
  Filled 2015-01-17: qty 0.5

## 2015-01-17 MED ORDER — FENTANYL 2.5 MCG/ML BUPIVACAINE 1/10 % EPIDURAL INFUSION (WH - ANES)
14.0000 mL/h | INTRAMUSCULAR | Status: DC | PRN
  Administered 2015-01-17 (×2): 14 mL/h via EPIDURAL

## 2015-01-17 MED ORDER — OXYTOCIN 40 UNITS IN LACTATED RINGERS INFUSION - SIMPLE MED
1.0000 m[IU]/min | INTRAVENOUS | Status: DC
  Administered 2015-01-17: 2 m[IU]/min via INTRAVENOUS
  Filled 2015-01-17: qty 1000

## 2015-01-17 MED ORDER — DIPHENHYDRAMINE HCL 50 MG/ML IJ SOLN: 12.5000 mg | INTRAMUSCULAR | Status: DC | PRN

## 2015-01-17 MED ORDER — TERBUTALINE SULFATE 1 MG/ML IJ SOLN
0.2500 mg | Freq: Once | INTRAMUSCULAR | Status: DC | PRN
  Filled 2015-01-17: qty 1

## 2015-01-17 MED ORDER — PHENYLEPHRINE 40 MCG/ML (10ML) SYRINGE FOR IV PUSH (FOR BLOOD PRESSURE SUPPORT)
80.0000 ug | PREFILLED_SYRINGE | INTRAVENOUS | Status: DC | PRN
  Filled 2015-01-17: qty 2
  Filled 2015-01-17: qty 20

## 2015-01-17 MED ORDER — CITRIC ACID-SODIUM CITRATE 334-500 MG/5ML PO SOLN: 30.0000 mL | ORAL | Status: DC | PRN

## 2015-01-17 MED ORDER — LANOLIN HYDROUS EX OINT
TOPICAL_OINTMENT | CUTANEOUS | Status: DC | PRN
Start: 2015-01-17 — End: 2015-01-19

## 2015-01-17 MED ORDER — OXYCODONE-ACETAMINOPHEN 5-325 MG PO TABS: 1.0000 | ORAL_TABLET | ORAL | Status: DC | PRN

## 2015-01-17 MED ORDER — OXYTOCIN 40 UNITS IN LACTATED RINGERS INFUSION - SIMPLE MED
62.5000 mL/h | INTRAVENOUS | Status: DC
  Administered 2015-01-17: 62.5 mL/h via INTRAVENOUS
  Filled 2015-01-17: qty 1000

## 2015-01-17 MED ORDER — ONDANSETRON HCL 4 MG/2ML IJ SOLN: 4.0000 mg | Freq: Four times a day (QID) | INTRAMUSCULAR | Status: DC | PRN

## 2015-01-17 MED ORDER — LACTATED RINGERS IV SOLN: 500.0000 mL | INTRAVENOUS | Status: DC | PRN

## 2015-01-17 MED ORDER — OXYTOCIN BOLUS FROM INFUSION
500.0000 mL | INTRAVENOUS | Status: DC
  Administered 2015-01-17: 500 mL via INTRAVENOUS

## 2015-01-17 MED ORDER — ACETAMINOPHEN 325 MG PO TABS: 650.0000 mg | ORAL_TABLET | ORAL | Status: DC | PRN

## 2015-01-17 MED ORDER — OXYCODONE-ACETAMINOPHEN 5-325 MG PO TABS: 2.0000 | ORAL_TABLET | ORAL | Status: DC | PRN

## 2015-01-17 MED ORDER — LIDOCAINE HCL (PF) 1 % IJ SOLN
INTRAMUSCULAR | Status: DC | PRN
  Administered 2015-01-17: 6 mL
  Administered 2015-01-17: 4 mL

## 2015-01-17 MED ORDER — SIMETHICONE 80 MG PO CHEW: 80.0000 mg | CHEWABLE_TABLET | ORAL | Status: DC | PRN

## 2015-01-17 MED ORDER — PRENATAL MULTIVITAMIN CH
1.0000 | ORAL_TABLET | Freq: Every day | ORAL | Status: DC
  Administered 2015-01-18 – 2015-01-19 (×2): 1 via ORAL
  Filled 2015-01-17 (×2): qty 1

## 2015-01-17 MED ORDER — SODIUM CHLORIDE 0.9 % IV SOLN
2.0000 g | Freq: Four times a day (QID) | INTRAVENOUS | Status: DC
  Administered 2015-01-17 (×3): 2 g via INTRAVENOUS
  Filled 2015-01-17 (×4): qty 2000

## 2015-01-17 MED ORDER — SENNOSIDES-DOCUSATE SODIUM 8.6-50 MG PO TABS
2.0000 | ORAL_TABLET | ORAL | Status: DC
  Administered 2015-01-18 – 2015-01-19 (×2): 2 via ORAL
  Filled 2015-01-17 (×2): qty 2

## 2015-01-17 MED ORDER — IBUPROFEN 600 MG PO TABS
600.0000 mg | ORAL_TABLET | Freq: Four times a day (QID) | ORAL | Status: DC
  Administered 2015-01-18 – 2015-01-19 (×7): 600 mg via ORAL
  Filled 2015-01-17 (×7): qty 1

## 2015-01-17 MED ORDER — ONDANSETRON HCL 4 MG/2ML IJ SOLN: 4.0000 mg | INTRAMUSCULAR | Status: DC | PRN

## 2015-01-17 MED ORDER — PHENYLEPHRINE 40 MCG/ML (10ML) SYRINGE FOR IV PUSH (FOR BLOOD PRESSURE SUPPORT)
80.0000 ug | PREFILLED_SYRINGE | INTRAVENOUS | Status: DC | PRN
Start: 2015-01-17 — End: 2015-01-17
  Filled 2015-01-17: qty 2

## 2015-01-17 MED ORDER — LACTATED RINGERS IV SOLN
500.0000 mL | Freq: Once | INTRAVENOUS | Status: AC
  Administered 2015-01-17: 500 mL via INTRAVENOUS

## 2015-01-17 MED ORDER — DIBUCAINE 1 % RE OINT: 1.0000 "application " | TOPICAL_OINTMENT | RECTAL | Status: DC | PRN

## 2015-01-17 MED ORDER — DIPHENHYDRAMINE HCL 25 MG PO CAPS: 25.0000 mg | ORAL_CAPSULE | Freq: Four times a day (QID) | ORAL | Status: DC | PRN

## 2015-01-17 MED ORDER — LIDOCAINE HCL (PF) 1 % IJ SOLN
30.0000 mL | INTRAMUSCULAR | Status: DC | PRN
  Filled 2015-01-17: qty 30

## 2015-01-17 MED ORDER — BENZOCAINE-MENTHOL 20-0.5 % EX AERO: 1.0000 "application " | INHALATION_SPRAY | CUTANEOUS | Status: DC | PRN

## 2015-01-17 MED ORDER — ONDANSETRON HCL 4 MG PO TABS: 4.0000 mg | ORAL_TABLET | ORAL | Status: DC | PRN

## 2015-01-17 MED ORDER — SODIUM CHLORIDE 0.9 % IV SOLN
2.0000 g | Freq: Once | INTRAVENOUS | Status: AC
  Administered 2015-01-17: 2 g via INTRAVENOUS
  Filled 2015-01-17: qty 2000

## 2015-01-17 MED ORDER — FENTANYL 2.5 MCG/ML BUPIVACAINE 1/10 % EPIDURAL INFUSION (WH - ANES)
14.0000 mL/h | INTRAMUSCULAR | Status: DC | PRN
  Administered 2015-01-17: 14 mL/h via EPIDURAL
  Filled 2015-01-17 (×3): qty 125

## 2015-01-17 MED ORDER — FLEET ENEMA 7-19 GM/118ML RE ENEM: 1.0000 | ENEMA | RECTAL | Status: DC | PRN

## 2015-01-17 NOTE — Progress Notes (Signed)
Casey Sullivan is a 29 y.o. G4P0030 at [redacted]w[redacted]d admitted for active labor.  Subjective: Notes mild and irregular contractions. Denies chest pain, blurred vision, headache.  Comfortably with epidural.  Objective: BP 113/63 mmHg  Pulse 93  Temp(Src) 98.1 F (36.7 C) (Oral)  Resp 16  SpO2 100%  LMP 04/11/2014   Total I/O In: -  Out: 1000 [Urine:1000]  FHT:  FHR: 140 bpm, variability: moderate,  accelerations:  Present,  decelerations:  Absent UC:   irregular SVE:   Dilation: 8 Effacement (%): 80 Station: 0 Exam by:: Colon Branch & Dr. Gerlean Ren  Labs: Lab Results  Component Value Date   WBC 13.8* 01/16/2015   HGB 12.3 01/16/2015   HCT 35.5* 01/16/2015   MCV 80.1 01/16/2015   PLT 261 01/16/2015    Assessment / Plan: Spontaneous labor, progressing normally  Labor: Progressing normally. Bulging bag noted on cervical exam. Will consider rupturing membranes this am. Preeclampsia:  no signs or symptoms of toxicity Fetal Wellbeing:  Category I Pain Control:  Epidural I/D:  GBS positive. Ampicillin Anticipated MOD:  NSVD  Lorna Few 01/17/2015, 6:40 AM  I have seen and examined this patient and agree the above assessment. CRESENZO-DISHMAN,Koben Daman 01/17/2015 8:35 PM

## 2015-01-17 NOTE — Progress Notes (Signed)
LABOR PROGRESS NOTE  Casey Sullivan is a 29 y.o. G4P0030 at [redacted]w[redacted]d  admitted for active labor.  Subjective: Feeling increasing pressure, but comfortable with epidural. Denies chest pain, blurred vision, headache.   Objective: BP 111/84 mmHg  Pulse 78  Temp(Src) 98.7 F (37.1 C) (Oral)  Resp 20  SpO2 100%  LMP 04/11/2014 or  Filed Vitals:   01/17/15 1533 01/17/15 1600 01/17/15 1632 01/17/15 1700  BP: 133/74 117/70 109/59 111/84  Pulse: 107 89 78   Temp:    98.7 F (37.1 C)  TempSrc:    Oral  Resp: 20     SpO2:       Dilation: 6 Effacement (%): 90 Cervical Position: Middle Station: 0 Presentation: Vertex Exam by:: Dr. Deniece Ree   Labs: Lab Results  Component Value Date   WBC 13.8* 01/16/2015   HGB 12.3 01/16/2015   HCT 35.5* 01/16/2015   MCV 80.1 01/16/2015   PLT 261 01/16/2015    Patient Active Problem List   Diagnosis Date Noted  . Active labor 01/17/2015  . Low-lying placenta   . [redacted] weeks gestation of pregnancy   . Placenta previa antepartum in third trimester   . [redacted] weeks gestation of pregnancy     Assessment / Plan: 29 y.o. G4P0030 at [redacted]w[redacted]d admitted in active labor.   Labor: Spontaneous labor, progressing normally but minimal change since prev exam; placed IUPC Fetal Wellbeing: FHR bpm, minimal variability.  Decelerations: absent Pain Control:  Epidural I/D: GBS+, ampicillin Anticipated MOD:  NSVD  Jarry Manon, MD 01/17/2015, 5:23 PM

## 2015-01-17 NOTE — Progress Notes (Signed)
Casey Sullivan is a 29 y.o. G4P0030 at [redacted]w[redacted]d admitted for onset of labor.  Subjective: Continues to note mild contraction and pressure, but states these are much more tolerable following epidural. Denies headache, chest pain, blurred vision.  Objective: BP 143/78 mmHg  Pulse 100  Temp(Src) 98.5 F (36.9 C) (Oral)  Resp 16  SpO2 100%  LMP 04/11/2014      FHT:  FHR: 140 bpm, variability: moderate,  accelerations:  Present,  decelerations:  Absent UC:   Present every 4-5 minutes  SVE:   Dilation: 8 Effacement (%): 80 Station: 0 Exam by:: Colon Branch RN & Dr. Gerlean Ren  Labs: Lab Results  Component Value Date   WBC 13.8* 01/16/2015   HGB 12.3 01/16/2015   HCT 35.5* 01/16/2015   MCV 80.1 01/16/2015   PLT 261 01/16/2015    Assessment / Plan: Spontaneous labor, progressing normally  Labor: Progressing normally Preeclampsia:  no signs or symptoms of toxicity Fetal Wellbeing:  Category I Pain Control:  Epidural I/D:  GBS positive. Ampicillin. Anticipated MOD:  NSVD  Lorna Few 01/17/2015, 2:45 AM

## 2015-01-17 NOTE — Progress Notes (Signed)
LABOR PROGRESS NOTE  Casey Sullivan is a 29 y.o. G4P0030 at [redacted]w[redacted]d  admitted for active labor with   Subjective:  very uncomfortable with contractions despite epidural, having severe back pain  Objective: BP 125/71 mmHg  Pulse 92  Temp(Src) 98.1 F (36.7 C) (Oral)  Resp 22  SpO2 100%  LMP 04/11/2014 or  Filed Vitals:   01/17/15 1733 01/17/15 1800 01/17/15 1829 01/17/15 1906  BP: 123/61 133/80 119/91 125/71  Pulse: 92 97 97 92  Temp:    98.1 F (36.7 C)  TempSrc:    Oral  Resp:    22  SpO2:       Dilation: 6.5 Effacement (%): 90 Cervical Position: Middle Station: 0 Presentation: Vertex Exam by:: Dr. Deniece Ree  Labs: Lab Results  Component Value Date   WBC 13.8* 01/16/2015   HGB 12.3 01/16/2015   HCT 35.5* 01/16/2015   MCV 80.1 01/16/2015   PLT 261 01/16/2015    Patient Active Problem List   Diagnosis Date Noted  . Active labor 01/17/2015  . Low-lying placenta   . [redacted] weeks gestation of pregnancy   . Placenta previa antepartum in third trimester   . [redacted] weeks gestation of pregnancy     Assessment / Plan: 29 y.o. G4P0030 at [redacted]w[redacted]d admitted in active labor.   Labor: not progressing well - 0900: 6/90/-1 by my exam, AROM at that time - 1030: pit started - 1640: IUPC placed, OP on exam, attempted to rotate, cervical swelling noted - 1900: contractions adequate, now 6-7/90/-1 has made minimal change but change is now notable with decreased cervical swelling.  Again attempted to gently rotate, continue position changes.  Fetal Wellbeing: FHR bpm, minimal variability.  Decelerations: absent Pain Control:  Epidural I/D: GBS+, ampicillin Anticipated MOD:  NSVD  Tyran Huser ROCIO, MD 01/17/2015, 7:20 PM

## 2015-01-17 NOTE — Anesthesia Procedure Notes (Signed)
Epidural Patient location during procedure: OB  Preanesthetic Checklist Completed: patient identified, site marked, surgical consent, pre-op evaluation, timeout performed, IV checked, risks and benefits discussed and monitors and equipment checked  Epidural Patient position: sitting Prep: site prepped and draped and DuraPrep Patient monitoring: continuous pulse ox and blood pressure Approach: midline Injection technique: LOR air  Needle:  Needle type: Tuohy  Needle gauge: 17 G Needle length: 9 cm and 9 Needle insertion depth: 6 cm Catheter type: closed end flexible Catheter size: 19 Gauge Catheter at skin depth: 12 cm Test dose: negative  Assessment Events: blood not aspirated, injection not painful, no injection resistance, negative IV test and no paresthesia  Additional Notes Dosing of Epidural:  1st dose, through catheter ............................................Marland Kitchen  Xylocaine 40 mg  2nd dose, through catheter, after waiting 3 minutes........Marland KitchenXylocaine 60 mg    ( 1% Xylo charted as a single dose in Epic Meds for ease of charting; actual dosing was fractionated as above, for saftey's sake)  As each dose occurred, patient was free of IV sx; and patient exhibited no evidence of SA injection.  Patient is more comfortable after epidural dosed. Please see RN's note for documentation of vital signs,and FHR which are stable.  Patient reminded not to try to ambulate with numb legs, and that an RN must be present when she attempts to get up.

## 2015-01-17 NOTE — MAU Note (Signed)
Manus Gunning CNM notified of patient status. Patient to be admitted to labor and delivery. IV started. Patient transported to room 165 via wheelchair accompanied by RN.

## 2015-01-17 NOTE — Anesthesia Preprocedure Evaluation (Signed)

## 2015-01-17 NOTE — MAU Note (Signed)
Patient presents to MAU with c/o contractions every 3 minutes. Denies LOF, VB at this time. +FM. Patient requesting epidural.

## 2015-01-17 NOTE — Progress Notes (Signed)
Casey Sullivan is a 29 y.o. G4P0030 at [redacted]w[redacted]d   Subjective: Pushing x 23mins now, making good progress  Objective: BP 125/71 mmHg  Pulse 92  Temp(Src) 98.6 F (37 C) (Oral)  Resp 22  SpO2 100%  LMP 04/11/2014 I/O last 3 completed shifts: In: -  Out: 2000 [Urine:2000]    FHT:  FHR: 140s bpm, variability: moderate,  accelerations:  Present,  decelerations:  Present early onset variables with pushing UC:   irregular, every 1.5-4 minutes w/ Pitocin @ 63mu/min SVE:   Dilation: 10 Effacement (%): 100 Station: +1 Exam by:: C. Blackstock, RN- beginning to see head with pushing, +2/+3  Labs: Lab Results  Component Value Date   WBC 13.8* 01/16/2015   HGB 12.3 01/16/2015   HCT 35.5* 01/16/2015   MCV 80.1 01/16/2015   PLT 261 01/16/2015    Assessment / Plan: Pushing x 30 mins  Continue with pushing Anticipate SVD  SHAW, KIMBERLY CNM 01/17/2015, 9:16 PM

## 2015-01-18 LAB — CBC
HCT: 29 % — ABNORMAL LOW (ref 36.0–46.0)
Hemoglobin: 10 g/dL — ABNORMAL LOW (ref 12.0–15.0)
MCH: 28.1 pg (ref 26.0–34.0)
MCHC: 34.8 g/dL (ref 30.0–36.0)
MCV: 80.8 fL (ref 78.0–100.0)
Platelets: 213 10*3/uL (ref 150–400)
RBC: 3.59 MIL/uL — ABNORMAL LOW (ref 3.87–5.11)
RDW: 14.1 % (ref 11.5–15.5)
WBC: 22.3 10*3/uL — AB (ref 4.0–10.5)

## 2015-01-18 LAB — RPR: RPR: NONREACTIVE

## 2015-01-18 MED ORDER — ACETAMINOPHEN 325 MG PO TABS
650.0000 mg | ORAL_TABLET | Freq: Four times a day (QID) | ORAL | Status: DC | PRN
  Administered 2015-01-18: 650 mg via ORAL
  Filled 2015-01-18: qty 2

## 2015-01-18 NOTE — Lactation Note (Signed)
This note was copied from the chart of Casey Sullivan. Lactation Consultation Note  Patient Name: Casey Milyn Stapleton [id number] Date: 01/18/2015 Reason for consult: Initial assessment Baby has not been interested in BF today, now 54-17 hours old. Baby is awake at this visit, [place] attempted to do suck exam. Baby tongue thrusting and not wanting to suckle, gets gaggy. Demonstrated hand expression to Mom, no colostrum visible. Mom has tubular shaped breasts, 3-4 FB space between breasts, erect nipples that flatten slightly with breast compression, some aerola edema. Using breast compression, baby would obtain latch however would not suckle at the breast, gaggy at the breast as well, scant amount of clear mucous noted. Demonstrated to Mom how to use hand pump to pre-pump to help with latch, baby still not interested in BF at this visit. Placed baby STS on Mom. Basic BF teaching done. Advised Mom to BF with feeding ques, if she does not observe feeding ques by 3 hours from last attempt or feeding, place baby STS and see if she will become interested in BF. Advised once baby is over 65 hours old, baby should be at the breast 8-12 times in 24 hours and with feeding ques. Lactation brochure left for review. Advised of OP services and support group. Encouraged to call with next feeding for assist.   Maternal Data Has patient been taught Hand Expression?: Yes Does the patient have breastfeeding experience prior to this delivery?: No  Feeding Feeding Type: Breast Fed Length of feed: 0 min  LATCH Score/Interventions Latch: Too sleepy or reluctant, no latch achieved, no sucking elicited. Intervention(s): Teach feeding cues;Waking techniques  Audible Swallowing: None  Type of Nipple: Everted at rest and after stimulation (flatten with breast compression/aerola edema)  Comfort (Breast/Nipple): Soft / non-tender     Hold (Positioning): Assistance needed to correctly position infant at breast and  maintain latch. Intervention(s): Breastfeeding basics reviewed;Support Pillows;Position options;Skin to skin  LATCH Score: 5  Lactation Tools Discussed/Used Tools: Pump Breast pump type: Manual WIC Program: Yes   Consult Status Consult Status: Follow-up Date: 01/18/15 Follow-up type: In-patient    Katrine Coho 01/18/2015, 2:59 PM

## 2015-01-18 NOTE — Anesthesia Postprocedure Evaluation (Signed)
  Anesthesia Post-op Note  Patient: Casey Sullivan  Procedure(s) Performed: * No procedures listed *  Patient Location: Mother/Baby  Anesthesia Type:Epidural  Level of Consciousness: awake and alert   Airway and Oxygen Therapy: Patient Spontanous Breathing  Post-op Pain: mild  Post-op Assessment: Post-op Vital signs reviewed, Patient's Cardiovascular Status Stable, Respiratory Function Stable, No signs of Nausea or vomiting, Pain level controlled, No headache, No residual numbness and No residual motor weakness  Post-op Vital Signs: Reviewed  Last Vitals:  Filed Vitals:   01/18/15 0443  BP: 119/78  Pulse: 75  Temp: 36.3 C  Resp: 16    Complications: No apparent anesthesia complications

## 2015-01-18 NOTE — Progress Notes (Signed)
Post Partum Day #1  Subjective:  Casey Sullivan is a 29 y.o. [id number] [redacted]w[redacted]d s/p SVD.  No acute events overnight.  Pt denies problems with ambulating, voiding or po intake.  She denies nausea or vomiting.  Pain is well controlled.  She has had flatus. She has not had bowel movement.  Lochia Small.  Plan for birth control is undecided, however she would like to discuss this further prior to discharge.  Method of Feeding: Breast/Bottle   Objective: Blood pressure 119/78, pulse 75, temperature 97.4 F (36.3 C), temperature source Oral, resp. rate 16, last menstrual period 04/11/2014, SpO2 99 %, unknown if currently breastfeeding.  Physical Exam:  General: alert, cooperative and no distress Lochia:normal flow Chest: no increased work of breathing noted Abdomen: soft, nontender,  DVT Evaluation: No evidence of DVT seen on physical exam. Extremities: no edema   Recent Labs  01/16/15 2350 01/18/15 0550  HGB 12.3 10.0*  HCT 35.5* 29.0*    Assessment/Plan:  ASSESSMENT: Casey Sullivan is a 29 y.o. [id number] [redacted]w[redacted]d s/p SVD.  Plan for discharge tomorrow, Breastfeeding and Lactation consult   LOS: 2 days   Lorna Few 01/18/2015, 9:25 AM

## 2015-01-19 NOTE — Lactation Note (Signed)
This note was copied from the chart of Casey Sullivan. Lactation Consultation Note   Mother has small wide spaced breast.  Mother was able to hand express drops of colostrum today. Mother has been mostly formula feeding.  Asked if she wanted to breastfeed and she stated she did not think she had enough breastmilk. Reviewed supply and demand and the importance of breastfeeding often to establish her milk supply. Assisted in latching baby in football hold.  Sucks and some swallows observed with breast massage. Suggest she breastfeed first and then supplement w/ formula if she chooses.  Monitor voids/stools. Reviewed engorgement care and mastitis symptoms.  Provided mother with comfort gels for tender nipples.    Patient Name: Casey Lejla Moeser [id number] Date: 01/19/2015 Reason for consult: Follow-up assessment   Maternal Data    Feeding    LATCH Score/Interventions                      Lactation Tools Discussed/Used     Consult Status Consult Status: Complete    Carlye Grippe 01/19/2015, 9:20 AM

## 2015-01-19 NOTE — Discharge Instructions (Signed)

## 2015-01-19 NOTE — Discharge Summary (Signed)
Obstetric Discharge Summary Reason for Admission: onset of labor Prenatal Procedures: none Intrapartum Procedures: spontaneous vaginal delivery Postpartum Procedures: none Complications-Operative and Postpartum: none HEMOGLOBIN  Date Value Ref Range Status  01/18/2015 10.0* 12.0 - 15.0 g/dL Final    Comment:    REPEATED TO VERIFY DELTA CHECK NOTED    HCT  Date Value Ref Range Status  01/18/2015 29.0* 36.0 - 46.0 % Final   Admitted in active labor, doing well today.  Pain well controlled, normal lochia, bottle feeding baby but would like to breast feed.  Has been seen by lactation.  Delivery Note At 9:37 PM a healthy female was delivered via (Presentation: ; Occiput Anterior). APGAR: 8,8 ; weight: pending .  Placenta status: Intact, Spontaneous. Cord: with the following complications: None.   Anesthesia: Epidural  Episiotomy: None Lacerations: None Suture Repair: N/A Est. Blood Loss (mL): 300  Mom to postpartum. Baby to Couplet care / Skin to Skin.  Casey Sullivan 01/17/2015, 9:59 PM Physical Exam:  General: alert and cooperative Lochia: appropriate Uterine Fundus: firm Incision: n/a DVT Evaluation: No evidence of DVT seen on physical exam.  Discharge Diagnoses: Term Pregnancy-delivered  Discharge Information: Date: 01/19/2015 Activity: pelvic rest Diet: routine Medications: None Condition: stable Instructions: refer to practice specific booklet Discharge to: home Follow-up Information    Follow up In 5 weeks.      Newborn Data: Live born female  Birth Weight: 6 lb 7.8 oz (2943 g) APGAR: 8, 8  Home with mother.  Casey Sullivan 01/19/2015, 5:49 AM

## 2015-01-21 ENCOUNTER — Ambulatory Visit (HOSPITAL_COMMUNITY): Admission: RE | Admit: 2015-01-21 | Payer: Medicaid Other | Source: Ambulatory Visit

## 2015-01-21 NOTE — Progress Notes (Signed)
Post discharge chart review completed.  

## 2017-06-21 ENCOUNTER — Ambulatory Visit (INDEPENDENT_AMBULATORY_CARE_PROVIDER_SITE_OTHER): Payer: Medicaid Other | Admitting: General Practice

## 2017-06-21 ENCOUNTER — Encounter: Payer: Self-pay | Admitting: General Practice

## 2017-06-21 DIAGNOSIS — Z3201 Encounter for pregnancy test, result positive: Secondary | ICD-10-CM

## 2017-06-21 LAB — POCT PREGNANCY, URINE: Preg Test, Ur: POSITIVE — AB

## 2017-06-21 NOTE — Progress Notes (Signed)
Patient here for UPT today. UPT +. Patient reports first positive home test two weeks ago. LMP 05/09/17 EDD 02/13/18 [redacted]w[redacted]d. Patient denies current use of medications/vitamins. Encouraged patient to begin PNV & care around 10-11 weeks. Pregnancy verification letter given. Patient had no questions

## 2017-08-16 ENCOUNTER — Encounter: Payer: Self-pay | Admitting: Student

## 2017-08-16 ENCOUNTER — Ambulatory Visit (INDEPENDENT_AMBULATORY_CARE_PROVIDER_SITE_OTHER): Payer: Self-pay | Admitting: Student

## 2017-08-16 VITALS — BP 119/77 | HR 77 | Ht 66.0 in | Wt 186.5 lb

## 2017-08-16 DIAGNOSIS — Z331 Pregnant state, incidental: Secondary | ICD-10-CM

## 2017-08-16 DIAGNOSIS — Z1389 Encounter for screening for other disorder: Secondary | ICD-10-CM

## 2017-08-16 DIAGNOSIS — Z3480 Encounter for supervision of other normal pregnancy, unspecified trimester: Secondary | ICD-10-CM

## 2017-08-16 DIAGNOSIS — Z3482 Encounter for supervision of other normal pregnancy, second trimester: Secondary | ICD-10-CM

## 2017-08-16 DIAGNOSIS — O219 Vomiting of pregnancy, unspecified: Secondary | ICD-10-CM

## 2017-08-16 DIAGNOSIS — Z23 Encounter for immunization: Secondary | ICD-10-CM

## 2017-08-16 DIAGNOSIS — Z124 Encounter for screening for malignant neoplasm of cervix: Secondary | ICD-10-CM

## 2017-08-16 DIAGNOSIS — Z113 Encounter for screening for infections with a predominantly sexual mode of transmission: Secondary | ICD-10-CM

## 2017-08-16 MED ORDER — PROMETHAZINE HCL 25 MG PO TABS: 25.0000 mg | ORAL_TABLET | Freq: Four times a day (QID) | ORAL | 0 refills | Status: DC | PRN

## 2017-08-16 NOTE — Patient Instructions (Signed)

## 2017-08-16 NOTE — Progress Notes (Signed)
Subjective:    Casey Sullivan is a [id number] [redacted]w[redacted]d being seen today for her first obstetrical visit.  Her obstetrical history is significant for placenta previa in previous pregnancy- resolved & had SVD. Patient does intend to breast feed. Pregnancy history fully reviewed. This is a planned pregnancy with her spouse, whom she lives with. Patient is a stay at home mom.   Patient reports no complaints. Some mild nausea; symptoms have improved since beginning of pregnancy.   HISTORY: OB History  Gravida Para Term Preterm AB Living  4 1 1   2 1   SAB TAB Ectopic Multiple Live Births    2   0 1    # Outcome Date GA Lbr Len/2nd Weight Sex Delivery Anes PTL Lv  4 Current           3 Term 01/17/15 [redacted]w[redacted]d 22:41 / 00:56 6 lb 7.8 oz (2.943 kg) F Vag-Spont EPI  LIV     Birth Comments: Hgb, Normal, FA Newborn Screen Barcode: [id number] Date collected: 01/19/2015   2 TAB           1 TAB              Past Medical History:  Diagnosis Date  . Medical history non-contributory    Past Surgical History:  Procedure Laterality Date  . NO PAST SURGERIES    . OVUM / OOCYTE RETRIEVAL     Family History  Problem Relation Age of Onset  . Stroke Father      Exam  BP 119/77   Pulse 77   Ht 5\' 6"  (1.676 m)   Wt 186 lb 8 oz (84.6 kg)   LMP 05/09/2017 (Exact Date)   BMI 30.10 kg/m    Uterus:   enlarged, 14-16 wks size. Non tender  Pelvic Exam:    Perineum: No Hemorrhoids, Normal Perineum   Vulva: normal   Vagina:  normal mucosa, normal discharge   Cervix: no bleeding following Pap, no cervical motion tenderness, no lesions and closed   Adnexa: no mass, fullness, tenderness  System: Breast:  normal appearance, no masses or tenderness, No nipple retraction or dimpling, No nipple discharge or bleeding, No axillary or supraclavicular adenopathy, Normal to palpation without dominant masses   Skin: normal coloration and turgor, no rashes    Neurologic: oriented, normal mood   Extremities: normal  strength, tone, and muscle mass, no deformities   HEENT sclera clear, anicteric, neck supple with midline trachea, thyroid without masses and trachea midline   Mouth/Teeth mucous membranes moist, pharynx normal without lesions and dental hygiene good   Cardiovascular: regular rate and rhythm, no murmurs or gallops   Respiratory:  appears well, vitals normal, no respiratory distress, acyanotic, normal RR, chest clear, no wheezing, crepitations, rhonchi, normal symmetric air entry   Abdomen: soft, non-tender; bowel sounds normal; no masses,  no organomegaly      Assessment:    Pregnancy: G2R4270 Patient Active Problem List   Diagnosis Date Noted  . Encounter for supervision of other normal pregnancy, unspecified trimester 08/16/2017        Plan:  1. Encounter for supervision of other normal pregnancy, unspecified trimester  - Culture, OB Urine - Hemoglobinopathy evaluation - Cystic Fibrosis Mutation 97 - Obstetric Panel, Including HIV - VITAMIN D 25 Hydroxy (Vit-D Deficiency, Fractures) - Enroll Patient in Babyscripts - Flu Vaccine QUAD 36+ mos IM (Fluarix, Quad PF) - HgB A1c - Cytology - PAP - [place] MFM OB COMP + 14 WK; Future  2.  Encounter for immunization  - Flu Vaccine QUAD 36+ mos IM (Fluarix, Quad PF)  3. Nausea and vomiting during pregnancy prior to [redacted] weeks gestation  - promethazine (PHENERGAN) 25 MG tablet; Take 1 tablet (25 mg total) by mouth every 6 (six) hours as needed for nausea or vomiting.  Dispense: 30 tablet; Refill: 0  4. Encounter for routine screening for malformation using ultrasonics  - [place] MFM OB COMP + 14 WK; Future    Initial labs drawn. Prenatal vitamins. Problem list reviewed and updated. Genetic Screening discussed Quad Screen: collect at next visit.  Ultrasound discussed; fetal survey: ordered.  Follow up in 4 weeks.    Jorje Guild 08/16/2017

## 2017-08-18 LAB — CULTURE, OB URINE

## 2017-08-18 LAB — URINE CULTURE, OB REFLEX

## 2017-08-19 LAB — CYTOLOGY - PAP
Adequacy: ABSENT
BACTERIAL VAGINITIS: NEGATIVE
Candida vaginitis: NEGATIVE
Chlamydia: NEGATIVE
Diagnosis: NEGATIVE
HPV: NOT DETECTED
NEISSERIA GONORRHEA: NEGATIVE
Trichomonas: NEGATIVE

## 2017-08-23 ENCOUNTER — Encounter: Payer: Self-pay | Admitting: Student

## 2017-08-23 DIAGNOSIS — D573 Sickle-cell trait: Secondary | ICD-10-CM | POA: Insufficient documentation

## 2017-08-23 LAB — OBSTETRIC PANEL, INCLUDING HIV
Antibody Screen: NEGATIVE
Basophils Absolute: 0 10*3/uL (ref 0.0–0.2)
Basos: 0 %
EOS (ABSOLUTE): 0.1 10*3/uL (ref 0.0–0.4)
Eos: 1 %
HIV Screen 4th Generation wRfx: NONREACTIVE
Hematocrit: 33.7 % — ABNORMAL LOW (ref 34.0–46.6)
Hemoglobin: 11.2 g/dL (ref 11.1–15.9)
Hepatitis B Surface Ag: NEGATIVE
IMMATURE GRANULOCYTES: 0 %
Immature Grans (Abs): 0 10*3/uL (ref 0.0–0.1)
Lymphocytes Absolute: 1 10*3/uL (ref 0.7–3.1)
Lymphs: 16 %
MCH: 25.9 pg — ABNORMAL LOW (ref 26.6–33.0)
MCHC: 33.2 g/dL (ref 31.5–35.7)
MCV: 78 fL — ABNORMAL LOW (ref 79–97)
MONOCYTES: 4 %
Monocytes Absolute: 0.3 10*3/uL (ref 0.1–0.9)
Neutrophils Absolute: 5.1 10*3/uL (ref 1.4–7.0)
Neutrophils: 79 %
PLATELETS: 278 10*3/uL (ref 150–379)
RBC: 4.33 x10E6/uL (ref 3.77–5.28)
RDW: 14.4 % (ref 12.3–15.4)
RPR: NONREACTIVE
Rh Factor: POSITIVE
Rubella Antibodies, IGG: 23.4 index (ref >0.99)
WBC: 6.6 10*3/uL (ref 3.4–10.8)

## 2017-08-23 LAB — HEMOGLOBINOPATHY EVALUATION
HEMOGLOBIN F QUANTITATION: 0 % (ref 0.0–2.0)
HGB A: 61.6 % — AB (ref 96.4–98.8)
HGB C: 0 %
HGB S: 34.2 % — AB
HGB VARIANT: 0 %
Hemoglobin A2 Quantitation: 4.2 % — ABNORMAL HIGH (ref 1.8–3.2)

## 2017-08-23 LAB — VITAMIN D 25 HYDROXY (VIT D DEFICIENCY, FRACTURES): VIT D 25 HYDROXY: 26.9 ng/mL — AB (ref 30.0–100.0)

## 2017-08-23 LAB — HEMOGLOBIN A1C
ESTIMATED AVERAGE GLUCOSE: 100 mg/dL
HEMOGLOBIN A1C: 5.1 % (ref 4.8–5.6)

## 2017-08-23 LAB — AFP TETRA

## 2017-08-23 LAB — CYSTIC FIBROSIS MUTATION 97: Interpretation: NOT DETECTED

## 2017-09-12 ENCOUNTER — Ambulatory Visit (INDEPENDENT_AMBULATORY_CARE_PROVIDER_SITE_OTHER): Payer: Self-pay | Admitting: Advanced Practice Midwife

## 2017-09-12 VITALS — BP 125/74 | HR 93 | Wt 195.0 lb

## 2017-09-12 DIAGNOSIS — Z3402 Encounter for supervision of normal first pregnancy, second trimester: Secondary | ICD-10-CM

## 2017-09-12 NOTE — Progress Notes (Signed)
    Subjective:  Casey Sullivan is a 31 y.o. G4P1021 at [redacted]w[redacted]d being seen today for ongoing prenatal care.  She is currently monitored for the following issues for this low-risk pregnancy and has Encounter for supervision of other normal pregnancy, unspecified trimester and Sickle cell trait ([place]) on her problem list.  Patient reports no complaints.  Contractions: Not present. Vag. Bleeding: None.  Movement: Present. Denies leaking of fluid.   The following portions of the patient's history were reviewed and updated as appropriate: allergies, current medications, past family history, past medical history, past social history, past surgical history and problem list. Problem list updated.  Objective:   Vitals:   09/12/17 0908  BP: 125/74  Pulse: 93  Weight: 195 lb (88.5 kg)    Fetal Status: Fetal Heart Rate (bpm): 155   Movement: Present    17 cm General:  Alert, oriented and cooperative. Patient is in no acute distress.  Skin: Skin is warm and dry. No rash noted.   Cardiovascular: Normal heart rate noted  Respiratory: Normal respiratory effort, no problems with respiration noted  Abdomen: Soft, gravid, appropriate for gestational age. Pain/Pressure: Absent     Pelvic:  Cervical exam deferred        Extremities: Normal range of motion.  Edema: None  Mental Status: Normal mood and affect. Normal behavior. Normal judgment and thought content.   Urinalysis:      Assessment and Plan:  Pregnancy: G4P1021 at [redacted]w[redacted]d  1. Encounter for supervision of normal first pregnancy in second trimester  - AFP TETRA  Preterm labor symptoms and general obstetric precautions including but not limited to vaginal bleeding, contractions, leaking of fluid and fetal movement were reviewed in detail with the patient. Please refer to After Visit Summary for other counseling recommendations.  Return in about 4 weeks (around 10/10/2017).  Encouraged to apply for Medicaid ASAP Has been seen at [facility] but needs  to see Lactation there to get breast shells for later in her pregnancy  - was only able to breastfeed for one month last time due to nipples retracting and baby did not latch well. Virginia Rochester, NP

## 2017-09-12 NOTE — Patient Instructions (Signed)
Second Trimester of Pregnancy The second trimester is from week 13 through week 28, month 4 through 6. This is often the time in pregnancy that you feel your best. Often times, morning sickness has lessened or quit. You may have more energy, and you may get hungry more often. Your unborn baby (fetus) is growing rapidly. At the end of the sixth month, he or she is about 9 inches long and weighs about 1 pounds. You will likely feel the baby move (quickening) between 18 and 20 weeks of pregnancy. Follow these instructions at home:  Avoid all smoking, herbs, and alcohol. Avoid drugs not approved by your doctor.  Do not use any tobacco products, including cigarettes, chewing tobacco, and electronic cigarettes. If you need help quitting, ask your doctor. You may get counseling or other support to help you quit.  Only take medicine as told by your doctor. Some medicines are safe and some are not during pregnancy.  Exercise only as told by your doctor. Stop exercising if you start having cramps.  Eat regular, healthy meals.  Wear a good support bra if your breasts are tender.  Do not use hot tubs, steam rooms, or saunas.  Wear your seat belt when driving.  Avoid raw meat, uncooked cheese, and liter boxes and soil used by cats.  Take your prenatal vitamins.  Take 1500-2000 milligrams of calcium daily starting at the 20th week of pregnancy until you deliver your baby.  Try taking medicine that helps you poop (stool softener) as needed, and if your doctor approves. Eat more fiber by eating fresh fruit, vegetables, and whole grains. Drink enough fluids to keep your pee (urine) clear or pale yellow.  Take warm water baths (sitz baths) to soothe pain or discomfort caused by hemorrhoids. Use hemorrhoid cream if your doctor approves.  If you have puffy, bulging veins (varicose veins), wear support hose. Raise (elevate) your feet for 15 minutes, 3-4 times a day. Limit salt in your diet.  Avoid heavy  lifting, wear low heals, and sit up straight.  Rest with your legs raised if you have leg cramps or low back pain.  Visit your dentist if you have not gone during your pregnancy. Use a soft toothbrush to brush your teeth. Be gentle when you floss.  You can have sex (intercourse) unless your doctor tells you not to.  Go to your doctor visits. Get help if:  You feel dizzy.  You have mild cramps or pressure in your lower belly (abdomen).  You have a nagging pain in your belly area.  You continue to feel sick to your stomach (nauseous), throw up (vomit), or have watery poop (diarrhea).  You have bad smelling fluid coming from your vagina.  You have pain with peeing (urination). Get help right away if:  You have a fever.  You are leaking fluid from your vagina.  You have spotting or bleeding from your vagina.  You have severe belly cramping or pain.  You lose or gain weight rapidly.  You have trouble catching your breath and have chest pain.  You notice sudden or extreme puffiness (swelling) of your face, hands, ankles, feet, or legs.  You have not felt the baby move in over an hour.  You have severe headaches that do not go away with medicine.  You have vision changes. This information is not intended to replace advice given to you by your health care provider. Make sure you discuss any questions you have with your health care   provider. Document Released: 01/26/2010 Document Revised: 04/08/2016 Document Reviewed: 01/02/2013 Elsevier Interactive Patient Education  2017 Elsevier Inc.  

## 2017-09-13 ENCOUNTER — Encounter (HOSPITAL_COMMUNITY): Payer: Self-pay | Admitting: Student

## 2017-09-14 LAB — AFP TETRA
DIA Mom Value: 0.74
DIA VALUE (EIA): 106.38 pg/mL
DSR (BY AGE) 1 IN: 570
DSR (Second Trimester) 1 IN: 1377
GESTATIONAL AGE AFP: 18 wk
MSAFP Mom: 0.66
MSAFP: 26.3 ng/mL
MSHCG Mom: 1.18
MSHCG: 27178 m[IU]/mL
Maternal Age At EDD: 31.5 yr
Osb Risk: 10000
Test Results:: NEGATIVE
UE3 MOM: 0.71
Weight: 195 [lb_av]
uE3 Value: 0.84 ng/mL

## 2017-09-22 ENCOUNTER — Ambulatory Visit (HOSPITAL_COMMUNITY)
Admission: RE | Admit: 2017-09-22 | Discharge: 2017-09-22 | Disposition: A | Discharge status: Home / Self Care | Payer: Self-pay | Source: Ambulatory Visit | Attending: Student | Admitting: Student

## 2017-09-22 ENCOUNTER — Other Ambulatory Visit: Payer: Self-pay | Admitting: Student

## 2017-09-22 ENCOUNTER — Other Ambulatory Visit (HOSPITAL_COMMUNITY): Payer: Self-pay | Admitting: *Deleted

## 2017-09-22 DIAGNOSIS — IMO0002 Reserved for concepts with insufficient information to code with codable children: Secondary | ICD-10-CM

## 2017-09-22 DIAGNOSIS — Z363 Encounter for antenatal screening for malformations: Secondary | ICD-10-CM

## 2017-09-22 DIAGNOSIS — Z1389 Encounter for screening for other disorder: Secondary | ICD-10-CM

## 2017-09-22 DIAGNOSIS — Z3480 Encounter for supervision of other normal pregnancy, unspecified trimester: Secondary | ICD-10-CM

## 2017-09-22 DIAGNOSIS — Z3A19 19 weeks gestation of pregnancy: Secondary | ICD-10-CM

## 2017-09-22 DIAGNOSIS — O358XX0 Maternal care for other (suspected) fetal abnormality and damage, not applicable or unspecified: Secondary | ICD-10-CM | POA: Insufficient documentation

## 2017-09-22 DIAGNOSIS — Z0489 Encounter for examination and observation for other specified reasons: Secondary | ICD-10-CM

## 2017-10-10 ENCOUNTER — Encounter: Payer: Self-pay | Admitting: Advanced Practice Midwife

## 2017-10-20 ENCOUNTER — Encounter (HOSPITAL_COMMUNITY): Payer: Self-pay

## 2017-10-20 ENCOUNTER — Ambulatory Visit (HOSPITAL_COMMUNITY)
Admission: RE | Admit: 2017-10-20 | Discharge: 2017-10-20 | Disposition: A | Discharge status: Home / Self Care | Payer: Self-pay | Source: Ambulatory Visit | Attending: Student | Admitting: Student

## 2017-10-20 DIAGNOSIS — Z3A23 23 weeks gestation of pregnancy: Secondary | ICD-10-CM | POA: Insufficient documentation

## 2017-10-20 DIAGNOSIS — Z0489 Encounter for examination and observation for other specified reasons: Secondary | ICD-10-CM

## 2017-10-20 DIAGNOSIS — IMO0002 Reserved for concepts with insufficient information to code with codable children: Secondary | ICD-10-CM

## 2017-10-20 DIAGNOSIS — O99012 Anemia complicating pregnancy, second trimester: Secondary | ICD-10-CM | POA: Insufficient documentation

## 2017-10-20 DIAGNOSIS — O358XX0 Maternal care for other (suspected) fetal abnormality and damage, not applicable or unspecified: Secondary | ICD-10-CM | POA: Insufficient documentation

## 2017-10-20 DIAGNOSIS — D573 Sickle-cell trait: Secondary | ICD-10-CM | POA: Insufficient documentation

## 2017-12-28 ENCOUNTER — Encounter (HOSPITAL_COMMUNITY): Payer: Self-pay | Admitting: *Deleted

## 2017-12-28 ENCOUNTER — Other Ambulatory Visit: Payer: Self-pay

## 2017-12-28 ENCOUNTER — Inpatient Hospital Stay (HOSPITAL_COMMUNITY)
Admission: AD | Admit: 2017-12-28 | Discharge: 2017-12-28 | Disposition: A | Discharge status: Home / Self Care | Payer: Self-pay | Source: Ambulatory Visit | Attending: Obstetrics and Gynecology | Admitting: Obstetrics and Gynecology

## 2017-12-28 DIAGNOSIS — N760 Acute vaginitis: Secondary | ICD-10-CM

## 2017-12-28 DIAGNOSIS — R102 Pelvic and perineal pain: Secondary | ICD-10-CM

## 2017-12-28 DIAGNOSIS — O26893 Other specified pregnancy related conditions, third trimester: Secondary | ICD-10-CM

## 2017-12-28 DIAGNOSIS — Z3A33 33 weeks gestation of pregnancy: Secondary | ICD-10-CM | POA: Insufficient documentation

## 2017-12-28 DIAGNOSIS — B9689 Other specified bacterial agents as the cause of diseases classified elsewhere: Secondary | ICD-10-CM | POA: Insufficient documentation

## 2017-12-28 DIAGNOSIS — O26899 Other specified pregnancy related conditions, unspecified trimester: Secondary | ICD-10-CM

## 2017-12-28 DIAGNOSIS — O23593 Infection of other part of genital tract in pregnancy, third trimester: Secondary | ICD-10-CM | POA: Insufficient documentation

## 2017-12-28 LAB — URINALYSIS, ROUTINE W REFLEX MICROSCOPIC
BILIRUBIN URINE: NEGATIVE
Glucose, UA: NEGATIVE mg/dL
HGB URINE DIPSTICK: NEGATIVE
KETONES UR: NEGATIVE mg/dL
Leukocytes, UA: NEGATIVE
Nitrite: NEGATIVE
PROTEIN: NEGATIVE mg/dL
Specific Gravity, Urine: 1.005 (ref 1.005–1.030)
pH: 7 (ref 5.0–8.0)

## 2017-12-28 LAB — WET PREP, GENITAL
Sperm: NONE SEEN
Trich, Wet Prep: NONE SEEN
Yeast Wet Prep HPF POC: NONE SEEN

## 2017-12-28 LAB — OB RESULTS CONSOLE GC/CHLAMYDIA: GC PROBE AMP, GENITAL: NEGATIVE

## 2017-12-28 MED ORDER — METRONIDAZOLE 500 MG PO TABS: 500.0000 mg | ORAL_TABLET | Freq: Two times a day (BID) | ORAL | 0 refills | Status: DC

## 2017-12-28 MED ORDER — COMFORT FIT MATERNITY SUPP SM MISC: 1.0000 | Freq: Every day | 0 refills | Status: DC

## 2017-12-28 NOTE — MAU Provider Note (Signed)
History     CSN: [id number]  Arrival date and time: 12/28/17 8676   First Provider Initiated Contact with Patient 12/28/17 1058      Chief Complaint  Patient presents with  . Abdominal Pain  . Pelvic Pain   HPI Casey Sullivan is a 32 y.o. G4P1021 at [redacted]w[redacted]d who presents with pelvic pressure for 1 week. She feels like the baby is pushing down. She states it is worse when she is walking and changing positions. She also reports feeling intermittent contractions, usually 1 an hour. She rates the contraction pain a 3/10 and has not tried anything for the pain. She reports a mucous discharge when she wipes. Denies bleeding or leaking. Reports good fetal movement. She gets care at [facility] but has not been seen since October.  OB History    Gravida Para Term Preterm AB Living   4 1 1   2 1    SAB TAB Ectopic Multiple Live Births     2   0 1      Past Medical History:  Diagnosis Date  . Sickle cell trait [facility])     Past Surgical History:  Procedure Laterality Date  . NO PAST SURGERIES    . OVUM / OOCYTE RETRIEVAL     egg donation    Family History  Problem Relation Age of Onset  . Stroke Father     Social History   Tobacco Use  . Smoking status: Never Smoker  . Smokeless tobacco: Never Used  Substance Use Topics  . Alcohol use: Yes    Comment: occasioanl, none with preg  . Drug use: No    Allergies: No Known Allergies  Medications Prior to Admission  Medication Sig Dispense Refill Last Dose  . Prenatal Vit-Fe Fumarate-FA (PRENATAL VITAMINS PLUS) 27-1 MG TABS Take 1 tablet by mouth 1 day or 1 dose. (Patient taking differently: Take 1 tablet by mouth daily. ) 30 tablet 3 12/28/2017 at Unknown time    Review of Systems  Constitutional: Negative.  Negative for fatigue and fever.  HENT: Negative.   Respiratory: Negative.  Negative for shortness of breath.   Cardiovascular: Negative.  Negative for chest pain.  Gastrointestinal: Positive for abdominal pain. Negative for  constipation, diarrhea, nausea and vomiting.  Genitourinary: Positive for pelvic pain. Negative for dysuria.  Neurological: Negative.  Negative for dizziness and headaches.   Physical Exam   Blood pressure 134/69, pulse 87, temperature 98.7 F (37.1 C), temperature source Oral, resp. rate 18, weight 207 lb (93.9 kg), last menstrual period 05/09/2017, SpO2 100 %, unknown if currently breastfeeding.  Physical Exam  Nursing note and vitals reviewed. Constitutional: She is oriented to person, place, and time. She appears well-developed and well-nourished. No distress.  HENT:  Head: Normocephalic.  Eyes: Pupils are equal, round, and reactive to light.  Cardiovascular: Normal rate, regular rhythm and normal heart sounds.  Respiratory: Effort normal and breath sounds normal. No respiratory distress.  GI: Soft. Bowel sounds are normal. She exhibits no distension. There is no tenderness.  Genitourinary: No vaginal discharge found.  Neurological: She is alert and oriented to person, place, and time.  Skin: Skin is warm and dry.  Psychiatric: She has a normal mood and affect. Her behavior is normal. Judgment and thought content normal.   Dilation: Closed Effacement (%): Thick Cervical Position: Posterior Exam by:: Haynes Bast CNM  Fetal Tracing:  Baseline: 140 Variability: moderate Accels: 15x15 Decels: none  Toco: none  MAU Course  Procedures Results for  orders placed or performed during the hospital encounter of 12/28/17 (from the past 24 hour(s))  Urinalysis, Routine w reflex microscopic     Status: Abnormal   Collection Time: 12/28/17 10:00 AM  Result Value Ref Range   Color, Urine YELLOW YELLOW   APPearance HAZY (A) CLEAR   Specific Gravity, Urine 1.005 1.005 - 1.030   pH 7.0 5.0 - 8.0   Glucose, UA NEGATIVE NEGATIVE mg/dL   Hgb urine dipstick NEGATIVE NEGATIVE   Bilirubin Urine NEGATIVE NEGATIVE   Ketones, ur NEGATIVE NEGATIVE mg/dL   Protein, ur NEGATIVE NEGATIVE mg/dL    Nitrite NEGATIVE NEGATIVE   Leukocytes, UA NEGATIVE NEGATIVE  Wet prep, genital     Status: Abnormal   Collection Time: 12/28/17 11:07 AM  Result Value Ref Range   Yeast Wet Prep HPF POC NONE SEEN NONE SEEN   Trich, Wet Prep NONE SEEN NONE SEEN   Clue Cells Wet Prep HPF POC PRESENT (A) NONE SEEN   WBC, Wet Prep HPF POC MODERATE (A) NONE SEEN   Sperm NONE SEEN    MDM UA Wet prep and gc/chlamydia  Assessment and Plan   1. Pain of round ligament affecting pregnancy, antepartum   2. Bacterial vaginosis   3. [redacted] weeks gestation of pregnancy    -Discharge home in stable condition -Rx for metronidazole and maternity support belt given to patient -Preterm labor precautions discussed -Patient advised to follow-up with [facility] as soon as possible for prenatal care -Patient may return to MAU as needed or if her condition were to change or worsen   Wende Mott CNM 12/28/2017, 11:29 AM

## 2017-12-28 NOTE — MAU Note (Signed)
Having pressure in lower abd/pelvis.  When she wipes she sees a bit of the mucous plug

## 2017-12-28 NOTE — MAU Note (Signed)
Urine in lab 

## 2017-12-28 NOTE — Progress Notes (Signed)
1140: assumed care of pt. Provide okayed to d/c monitor. VS stable and instructed pt to get dressed for d/c.  1148: D/c instructions given with pt understanding. Pt left unit via ambulatory with friend/family.

## 2017-12-29 LAB — GC/CHLAMYDIA PROBE AMP (~~LOC~~) NOT AT ARMC
Chlamydia: NEGATIVE
Neisseria Gonorrhea: NEGATIVE

## 2018-01-02 ENCOUNTER — Ambulatory Visit (INDEPENDENT_AMBULATORY_CARE_PROVIDER_SITE_OTHER): Payer: Self-pay | Admitting: Advanced Practice Midwife

## 2018-01-02 ENCOUNTER — Encounter: Payer: Self-pay | Admitting: Advanced Practice Midwife

## 2018-01-02 VITALS — BP 127/70 | HR 100 | Wt 208.0 lb

## 2018-01-02 DIAGNOSIS — O0933 Supervision of pregnancy with insufficient antenatal care, third trimester: Secondary | ICD-10-CM

## 2018-01-02 DIAGNOSIS — Z3480 Encounter for supervision of other normal pregnancy, unspecified trimester: Secondary | ICD-10-CM

## 2018-01-02 NOTE — Progress Notes (Signed)
Here for prenatal visit. Was seen MAU last wk for lower abd pain. Denies any pain or concerns today

## 2018-01-02 NOTE — Progress Notes (Signed)
   PRENATAL VISIT NOTE  Subjective:  Kaelani Kendrick is a 32 y.o. G4P1021 at [redacted]w[redacted]d being seen today for ongoing prenatal care.  She is currently monitored for the following issues for this low-risk pregnancy and has Encounter for supervision of other normal pregnancy, unspecified trimester; Sickle cell trait ([place]); and Limited prenatal care, third trimester on their problem list.  Patient reports no complaints.  Contractions: Not present. Vag. Bleeding: None.  Movement: Present. Denies leaking of fluid.   The following portions of the patient's history were reviewed and updated as appropriate: allergies, current medications, past family history, past medical history, past social history, past surgical history and problem list. Problem list updated.  Objective:   Vitals:   01/02/18 1635  BP: 127/70  Pulse: 100  Weight: 208 lb (94.3 kg)    Fetal Status: Fetal Heart Rate (bpm): 143 Fundal Height: 35 cm Movement: Present     General:  Alert, oriented and cooperative. Patient is in no acute distress.  Skin: Skin is warm and dry. No rash noted.   Cardiovascular: Normal heart rate noted  Respiratory: Normal respiratory effort, no problems with respiration noted  Abdomen: Soft, gravid, appropriate for gestational age.  Pain/Pressure: Absent     Pelvic: Cervical exam deferred        Extremities: Normal range of motion.  Edema: None  Mental Status:  Normal mood and affect. Normal behavior. Normal judgment and thought content.   Assessment and Plan:  Pregnancy: G4P1021 at [redacted]w[redacted]d  1. Encounter for supervision of other normal pregnancy, unspecified trimester - Lapse in care, no seen since October: Patient states that she missed her appt and then forgot to call to reschedule.  - Needs 2 hour GTT ASAP - GBS at next visit   Preterm labor symptoms and general obstetric precautions including but not limited to vaginal bleeding, contractions, leaking of fluid and fetal movement were reviewed in  detail with the patient. Please refer to After Visit Summary for other counseling recommendations.  Return in about 2 weeks (around 01/16/2018).   Marcille Buffy, CNM

## 2018-01-03 ENCOUNTER — Other Ambulatory Visit: Payer: Self-pay

## 2018-01-03 DIAGNOSIS — Z3480 Encounter for supervision of other normal pregnancy, unspecified trimester: Secondary | ICD-10-CM

## 2018-01-03 NOTE — Addendum Note (Signed)
Addended by: MCNEILL, Deshan Hemmelgarn A on: 01/03/2018 08:39 AM   Modules accepted: Orders

## 2018-01-04 LAB — GLUCOSE TOLERANCE, 2 HOURS W/ 1HR
GLUCOSE, 1 HOUR: 137 mg/dL (ref 65–179)
GLUCOSE, FASTING: 82 mg/dL (ref 65–91)
Glucose, 2 hour: 120 mg/dL (ref 65–152)

## 2018-01-16 ENCOUNTER — Encounter: Payer: Self-pay | Admitting: Advanced Practice Midwife

## 2018-01-16 ENCOUNTER — Ambulatory Visit (INDEPENDENT_AMBULATORY_CARE_PROVIDER_SITE_OTHER): Payer: Self-pay | Admitting: Advanced Practice Midwife

## 2018-01-16 VITALS — BP 110/73 | HR 105 | Wt 209.4 lb

## 2018-01-16 DIAGNOSIS — Z3483 Encounter for supervision of other normal pregnancy, third trimester: Secondary | ICD-10-CM

## 2018-01-16 DIAGNOSIS — Z3480 Encounter for supervision of other normal pregnancy, unspecified trimester: Secondary | ICD-10-CM

## 2018-01-16 DIAGNOSIS — Z23 Encounter for immunization: Secondary | ICD-10-CM

## 2018-01-16 LAB — OB RESULTS CONSOLE GBS: STREP GROUP B AG: NEGATIVE

## 2018-01-16 NOTE — Progress Notes (Signed)
   PRENATAL VISIT NOTE  Subjective:  Casey Sullivan is a 32 y.o. G4P1021 at [redacted]w[redacted]d being seen today for ongoing prenatal care.  She is currently monitored for the following issues for this low-risk pregnancy and has Encounter for supervision of other normal pregnancy, unspecified trimester; Sickle cell trait ([place]); and Limited prenatal care, third trimester on their problem list.  Patient reports no complaints.  Contractions: Irregular. Vag. Bleeding: None.  Movement: Present. Denies leaking of fluid.   The following portions of the patient's history were reviewed and updated as appropriate: allergies, current medications, past family history, past medical history, past social history, past surgical history and problem list. Problem list updated.  Objective:   Vitals:   01/16/18 1459  BP: 110/73  Pulse: (!) 105  Weight: 209 lb 6.4 oz (95 kg)    Fetal Status: Fetal Heart Rate (bpm): 150 Fundal Height: 36 cm Movement: Present  Presentation: Vertex  General:  Alert, oriented and cooperative. Patient is in no acute distress.  Skin: Skin is warm and dry. No rash noted.   Cardiovascular: Normal heart rate noted  Respiratory: Normal respiratory effort, no problems with respiration noted  Abdomen: Soft, gravid, appropriate for gestational age.  Pain/Pressure: Present     Pelvic: Cervical exam performed Dilation: 1 Effacement (%): 50 Station: -3  Extremities: Normal range of motion.  Edema: None  Mental Status:  Normal mood and affect. Normal behavior. Normal judgment and thought content.   Assessment and Plan:  Pregnancy: G4P1021 at [redacted]w[redacted]d  1. Encounter for supervision of other normal pregnancy, unspecified trimester  - Culture, beta strep (group b only) - Tdap vaccine greater than or equal to 7yo IM  Term labor symptoms and general obstetric precautions including but not limited to vaginal bleeding, contractions, leaking of fluid and fetal movement were reviewed in detail with the  patient. Please refer to After Visit Summary for other counseling recommendations.  Return in about 1 week (around 01/23/2018).   Marcille Buffy, CNM

## 2018-01-20 LAB — CULTURE, BETA STREP (GROUP B ONLY): Strep Gp B Culture: NEGATIVE

## 2018-01-23 ENCOUNTER — Ambulatory Visit (INDEPENDENT_AMBULATORY_CARE_PROVIDER_SITE_OTHER): Payer: Self-pay | Admitting: Advanced Practice Midwife

## 2018-01-23 ENCOUNTER — Encounter: Payer: Self-pay | Admitting: Advanced Practice Midwife

## 2018-01-23 DIAGNOSIS — Z3480 Encounter for supervision of other normal pregnancy, unspecified trimester: Secondary | ICD-10-CM

## 2018-01-23 NOTE — Progress Notes (Addendum)
   PRENATAL VISIT NOTE  Subjective:  Casey Sullivan is a 32 y.o. G4P1021 at [redacted]w[redacted]d being seen today for ongoing prenatal care.  She is currently monitored for the following issues for this low-risk pregnancy and has Encounter for supervision of other normal pregnancy, unspecified trimester; Sickle cell trait ([place]); and Limited prenatal care, third trimester on their problem list.  Patient reports no complaints.  Contractions: Not present. Vag. Bleeding: None.  Movement: Present. Denies leaking of fluid.   Patient requesting cervical exam today.   The following portions of the patient's history were reviewed and updated as appropriate: allergies, current medications, past family history, past medical history, past social history, past surgical history and problem list. Problem list updated.  Objective:   Vitals:   01/23/18 1413  BP: 122/78  Pulse: (!) 110  Weight: 212 lb 4.8 oz (96.3 kg)    Fetal Status: Fetal Heart Rate (bpm): 147 Fundal Height: 37 cm Movement: Present  Presentation: Vertex  General:  Alert, oriented and cooperative. Patient is in no acute distress.  Skin: Skin is warm and dry. No rash noted.   Cardiovascular: Normal heart rate noted  Respiratory: Normal respiratory effort, no problems with respiration noted  Abdomen: Soft, gravid, appropriate for gestational age.  Pain/Pressure: Present     Pelvic: Cervical exam performed Dilation: 1 Effacement (%): 50 Station: -2  Extremities: Normal range of motion.  Edema: None  Mental Status:  Normal mood and affect. Normal behavior. Normal judgment and thought content.   Assessment and Plan:  Pregnancy: G4P1021 at [redacted]w[redacted]d  1. Encounter for supervision of other normal pregnancy, unspecified trimester - Routine care  Term labor symptoms and general obstetric precautions including but not limited to vaginal bleeding, contractions, leaking of fluid and fetal movement were reviewed in detail with the patient. Please refer to  After Visit Summary for other counseling recommendations.  Return in about 1 week (around 01/30/2018).   Marcille Buffy, CNM

## 2018-01-23 NOTE — Patient Instructions (Signed)
Vaginal delivery means that you will give birth by pushing your baby out of your birth canal (vagina). A team of health care providers will help you before, during, and after vaginal delivery. Birth experiences are unique for every woman and every pregnancy, and birth experiences vary depending on where you choose to give birth. What should I do to prepare for my baby's birth? Before your baby is born, it is important to talk with your health care provider about:  Your labor and delivery preferences. These may include: ? Medicines that you may be given. ? How you will manage your pain. This might include non-medical pain relief techniques or injectable pain relief such as epidural analgesia. ? How you and your baby will be monitored during labor and delivery. ? Who may be in the labor and delivery room with you. ? Your feelings about surgical delivery of your baby (cesarean delivery, or C-section) if this becomes necessary. ? Your feelings about receiving donated blood through an IV tube (blood transfusion) if this becomes necessary.  Whether you are able: ? To take pictures or videos of the birth. ? To eat during labor and delivery. ? To move around, walk, or change positions during labor and delivery.  What to expect after your baby is born, such as: ? Whether delayed umbilical cord clamping and cutting is offered. ? Who will care for your baby right after birth. ? Medicines or tests that may be recommended for your baby. ? Whether breastfeeding is supported in your hospital or birth center. ? How long you will be in the hospital or birth center.  How any medical conditions you have may affect your baby or your labor and delivery experience.  To prepare for your baby's birth, you should also:  Attend all of your health care visits before delivery (prenatal visits) as recommended by your health care provider. This is important.  Prepare your home for your baby's arrival. Make sure  that you have: ? Diapers. ? Baby clothing. ? Feeding equipment. ? Safe sleeping arrangements for you and your baby.  Install a car seat in your vehicle. Have your car seat checked by a certified car seat installer to make sure that it is installed safely.  Think about who will help you with your new baby at home for at least the first several weeks after delivery.  What can I expect when I arrive at the birth center or hospital? Once you are in labor and have been admitted into the hospital or birth center, your health care provider may:  Review your pregnancy history and any concerns you have.  Insert an IV tube into one of your veins. This is used to give you fluids and medicines.  Check your blood pressure, pulse, temperature, and heart rate (vital signs).  Check whether your bag of water (amniotic sac) has broken (ruptured).  Talk with you about your birth plan and discuss pain control options.  Monitoring Your health care provider may monitor your contractions (uterine monitoring) and your baby's heart rate (fetal monitoring). You may need to be monitored:  Often, but not continuously (intermittently).  All the time or for long periods at a time (continuously). Continuous monitoring may be needed if: ? You are taking certain medicines, such as medicine to relieve pain or make your contractions stronger. ? You have pregnancy or labor complications.  Monitoring may be done by:  Placing a special stethoscope or a handheld monitoring device on your abdomen to check your   baby's heartbeat, and feeling your abdomen for contractions. This method of monitoring does not continuously record your baby's heartbeat or your contractions.  Placing monitors on your abdomen (external monitors) to record your baby's heartbeat and the frequency and length of contractions. You may not have to wear external monitors all the time.  Placing monitors inside of your uterus (internal monitors) to  record your baby's heartbeat and the frequency, length, and strength of your contractions. ? Your health care provider may use internal monitors if he or she needs more information about the strength of your contractions or your baby's heart rate. ? Internal monitors are put in place by passing a thin, flexible wire through your vagina and into your uterus. Depending on the type of monitor, it may remain in your uterus or on your baby's head until birth. ? Your health care provider will discuss the benefits and risks of internal monitoring with you and will ask for your permission before inserting the monitors.  Telemetry. This is a type of continuous monitoring that can be done with external or internal monitors. Instead of having to stay in bed, you are able to move around during telemetry. Ask your health care provider if telemetry is an option for you.  Physical exam Your health care provider may perform a physical exam. This may include:  Checking whether your baby is positioned: ? With the head toward your vagina (head-down). This is most common. ? With the head toward the top of your uterus (head-up or breech). If your baby is in a breech position, your health care provider may try to turn your baby to a head-down position so you can deliver vaginally. If it does not seem that your baby can be born vaginally, your provider may recommend surgery to deliver your baby. In rare cases, you may be able to deliver vaginally if your baby is head-up (breech delivery). ? Lying sideways (transverse). Babies that are lying sideways cannot be delivered vaginally.  Checking your cervix to determine: ? Whether it is thinning out (effacing). ? Whether it is opening up (dilating). ? How low your baby has moved into your birth canal.  What are the three stages of labor and delivery?  Normal labor and delivery is divided into the following three stages: Stage 1  Stage 1 is the longest stage of labor,  and it can last for hours or days. Stage 1 includes: ? Early labor. This is when contractions may be irregular, or regular and mild. Generally, early labor contractions are more than 10 minutes apart. ? Active labor. This is when contractions get longer, more regular, more frequent, and more intense. ? The transition phase. This is when contractions happen very close together, are very intense, and may last longer than during any other part of labor.  Contractions generally feel mild, infrequent, and irregular at first. They get stronger, more frequent (about every 2-3 minutes), and more regular as you progress from early labor through active labor and transition.  Many women progress through stage 1 naturally, but you may need help to continue making progress. If this happens, your health care provider may talk with you about: ? Rupturing your amniotic sac if it has not ruptured yet. ? Giving you medicine to help make your contractions stronger and more frequent.  Stage 1 ends when your cervix is completely dilated to 4 inches (10 cm) and completely effaced. This happens at the end of the transition phase. Stage 2  Once your cervix   is completely effaced and dilated to 4 inches (10 cm), you may start to feel an urge to push. It is common for the body to naturally take a rest before feeling the urge to push, especially if you received an epidural or certain other pain medicines. This rest period may last for up to 1-2 hours, depending on your unique labor experience.  During stage 2, contractions are generally less painful, because pushing helps relieve contraction pain. Instead of contraction pain, you may feel stretching and burning pain, especially when the widest part of your baby's head passes through the vaginal opening (crowning).  Your health care provider will closely monitor your pushing progress and your baby's progress through the vagina during stage 2.  Your health care provider may  massage the area of skin between your vaginal opening and anus (perineum) or apply warm compresses to your perineum. This helps it stretch as the baby's head starts to crown, which can help prevent perineal tearing. ? In some cases, an incision may be made in your perineum (episiotomy) to allow the baby to pass through the vaginal opening. An episiotomy helps to make the opening of the vagina larger to allow more room for the baby to fit through.  It is very important to breathe and focus so your health care provider can control the delivery of your baby's head. Your health care provider may have you decrease the intensity of your pushing, to help prevent perineal tearing.  After delivery of your baby's head, the shoulders and the rest of the body generally deliver very quickly and without difficulty.  Once your baby is delivered, the umbilical cord may be cut right away, or this may be delayed for 1-2 minutes, depending on your baby's health. This may vary among health care providers, hospitals, and birth centers.  If you and your baby are healthy enough, your baby may be placed on your chest or abdomen to help maintain the baby's temperature and to help you bond with each other. Some mothers and babies start breastfeeding at this time. Your health care team will dry your baby and help keep your baby warm during this time.  Your baby may need immediate care if he or she: ? Showed signs of distress during labor. ? Has a medical condition. ? Was born too early (prematurely). ? Had a bowel movement before birth (meconium). ? Shows signs of difficulty transitioning from being inside the uterus to being outside of the uterus. If you are planning to breastfeed, your health care team will help you begin a feeding. Stage 3  The third stage of labor starts immediately after the birth of your baby and ends after you deliver the placenta. The placenta is an organ that develops during pregnancy to provide  oxygen and nutrients to your baby in the womb.  Delivering the placenta may require some pushing, and you may have mild contractions. Breastfeeding can stimulate contractions to help you deliver the placenta.  After the placenta is delivered, your uterus should tighten (contract) and become firm. This helps to stop bleeding in your uterus. To help your uterus contract and to control bleeding, your health care provider may: ? Give you medicine by injection, through an IV tube, by mouth, or through your rectum (rectally). ? Massage your abdomen or perform a vaginal exam to remove any blood clots that are left in your uterus. ? Empty your bladder by placing a thin, flexible tube (catheter) into your bladder. ? Encourage you to   breastfeed your baby. After labor is over, you and your baby will be monitored closely to ensure that you are both healthy until you are ready to go home. Your health care team will teach you how to care for yourself and your baby. This information is not intended to replace advice given to you by your health care provider. Make sure you discuss any questions you have with your health care provider. Document Released: 08/10/2008 Document Revised: 05/21/2016 Document Reviewed: 11/16/2015 Elsevier Interactive Patient Education  2018 [employer]  

## 2018-01-30 ENCOUNTER — Encounter: Payer: Self-pay | Admitting: Advanced Practice Midwife

## 2018-01-30 ENCOUNTER — Ambulatory Visit (INDEPENDENT_AMBULATORY_CARE_PROVIDER_SITE_OTHER): Payer: Self-pay | Admitting: Advanced Practice Midwife

## 2018-01-30 VITALS — BP 121/70 | HR 99 | Wt 213.6 lb

## 2018-01-30 DIAGNOSIS — Z3480 Encounter for supervision of other normal pregnancy, unspecified trimester: Secondary | ICD-10-CM

## 2018-01-30 NOTE — Progress Notes (Signed)
   PRENATAL VISIT NOTE  Subjective:  Casey Sullivan is a 32 y.o. G4P1021 at [redacted]w[redacted]d being seen today for ongoing prenatal care.  She is currently monitored for the following issues for this low-risk pregnancy and has Encounter for supervision of other normal pregnancy, unspecified trimester; Sickle cell trait ([place]); and Limited prenatal care, third trimester on their problem list.  Patient reports no complaints.  Contractions: Not present. Vag. Bleeding: None.  Movement: Present. Denies leaking of fluid.   The following portions of the patient's history were reviewed and updated as appropriate: allergies, current medications, past family history, past medical history, past social history, past surgical history and problem list. Problem list updated.  Objective:   Vitals:   01/30/18 0908  BP: 121/70  Pulse: 99  Weight: 213 lb 9.6 oz (96.9 kg)    Fetal Status: Fetal Heart Rate (bpm): 138 Fundal Height: 38 cm Movement: Present  Presentation: Vertex  General:  Alert, oriented and cooperative. Patient is in no acute distress.  Skin: Skin is warm and dry. No rash noted.   Cardiovascular: Normal heart rate noted  Respiratory: Normal respiratory effort, no problems with respiration noted  Abdomen: Soft, gravid, appropriate for gestational age.  Pain/Pressure: Absent     Pelvic: Cervical exam performed Dilation: 1 Effacement (%): 50 Station: -2  Extremities: Normal range of motion.  Edema: None  Mental Status:  Normal mood and affect. Normal behavior. Normal judgment and thought content.   Assessment and Plan:  Pregnancy: G4P1021 at [redacted]w[redacted]d  1. Encounter for supervision of other normal pregnancy, unspecified trimester - Routine care  Term labor symptoms and general obstetric precautions including but not limited to vaginal bleeding, contractions, leaking of fluid and fetal movement were reviewed in detail with the patient. Please refer to After Visit Summary for other counseling  recommendations.  Return in about 1 week (around 02/06/2018).   Marcille Buffy, CNM

## 2018-02-06 ENCOUNTER — Encounter: Payer: Self-pay | Admitting: Advanced Practice Midwife

## 2018-02-06 ENCOUNTER — Ambulatory Visit (INDEPENDENT_AMBULATORY_CARE_PROVIDER_SITE_OTHER): Payer: Self-pay | Admitting: Advanced Practice Midwife

## 2018-02-06 VITALS — BP 122/68 | HR 89 | Wt 215.9 lb

## 2018-02-06 DIAGNOSIS — Z3480 Encounter for supervision of other normal pregnancy, unspecified trimester: Secondary | ICD-10-CM

## 2018-02-06 NOTE — Progress Notes (Signed)
Educated pt on Skin to Skin

## 2018-02-06 NOTE — Progress Notes (Signed)
Patient ID: Casey Sullivan, female   DOB: Nov 08, 1986, 32 y.o.   MRN: [id number]   PRENATAL VISIT NOTE  Subjective:  Chanti Golubski is a 32 y.o. G4P1021 at [redacted]w[redacted]d being seen today for ongoing prenatal care.  She is currently monitored for the following issues for this low-risk pregnancy and has Encounter for supervision of other normal pregnancy, unspecified trimester; Sickle cell trait ([place]); and Limited prenatal care, third trimester on their problem list.  Patient reports no complaints.  Contractions: Not present. Vag. Bleeding: None.  Movement: Present. Denies leaking of fluid.   The following portions of the patient's history were reviewed and updated as appropriate: allergies, current medications, past family history, past medical history, past social history, past surgical history and problem list. Problem list updated.  Objective:   Vitals:   02/06/18 1044  BP: 122/68  Pulse: 89  Weight: 215 lb 14.4 oz (97.9 kg)    Fetal Status: Fetal Heart Rate (bpm): 136   Movement: Present     General:  Alert, oriented and cooperative. Patient is in no acute distress.  Skin: Skin is warm and dry. No rash noted.   Cardiovascular: Normal heart rate noted  Respiratory: Normal respiratory effort, no problems with respiration noted  Abdomen: Soft, gravid, appropriate for gestational age.  Pain/Pressure: Absent     Pelvic: Cervical exam deferred        Extremities: Normal range of motion.  Edema: Mild pitting, slight indentation  Mental Status:  Normal mood and affect. Normal behavior. Normal judgment and thought content.   Speculum exam for R/O rupture   External: no lesion Vagina: small amount of white discharge, no pooling  Cervix: pink, smooth, no fluid seen with valsalva, 1/50/-2  Uterus: AGA  Fern: NEGATIVE   Assessment and Plan:  Pregnancy: G4P1021 at [redacted]w[redacted]d  1. Encounter for supervision of other normal pregnancy, unspecified trimester - Urine Culture - Post dates management  discussed  - BPP/NST next week   Term labor symptoms and general obstetric precautions including but not limited to vaginal bleeding, contractions, leaking of fluid and fetal movement were reviewed in detail with the patient. Please refer to After Visit Summary for other counseling recommendations.  Return in about 1 week (around 02/13/2018).   Marcille Buffy, CNM

## 2018-02-13 ENCOUNTER — Telehealth (HOSPITAL_COMMUNITY): Payer: Self-pay | Admitting: *Deleted

## 2018-02-13 ENCOUNTER — Ambulatory Visit (INDEPENDENT_AMBULATORY_CARE_PROVIDER_SITE_OTHER): Payer: Self-pay | Admitting: Advanced Practice Midwife

## 2018-02-13 VITALS — BP 123/70 | HR 81 | Wt 216.1 lb

## 2018-02-13 DIAGNOSIS — Z3483 Encounter for supervision of other normal pregnancy, third trimester: Secondary | ICD-10-CM

## 2018-02-13 DIAGNOSIS — Z3480 Encounter for supervision of other normal pregnancy, unspecified trimester: Secondary | ICD-10-CM

## 2018-02-13 NOTE — Progress Notes (Signed)
   PRENATAL VISIT NOTE  Subjective:  Casey Sullivan is a 32 y.o. G4P1021 at [redacted]w[redacted]d being seen today for ongoing prenatal care.  She is currently monitored for the following issues for this low-risk pregnancy and has Encounter for supervision of other normal pregnancy, unspecified trimester; Sickle cell trait ([place]); and Limited prenatal care, third trimester on their problem list.  Patient reports no complaints.  Contractions: Irregular. Vag. Bleeding: None, [place].  Movement: Present. Denies leaking of fluid.   The following portions of the patient's history were reviewed and updated as appropriate: allergies, current medications, past family history, past medical history, past social history, past surgical history and problem list. Problem list updated.  Objective:   Vitals:   02/13/18 1046  BP: 123/70  Pulse: 81  Weight: 216 lb 1.6 oz (98 kg)    Fetal Status: Fetal Heart Rate (bpm): 135 Fundal Height: 39 cm Movement: Present     General:  Alert, oriented and cooperative. Patient is in no acute distress.  Skin: Skin is warm and dry. No rash noted.   Cardiovascular: Normal heart rate noted  Respiratory: Normal respiratory effort, no problems with respiration noted  Abdomen: Soft, gravid, appropriate for gestational age.  Pain/Pressure: Present     Pelvic: Cervical exam performed        Extremities: Normal range of motion.  Edema: None  Mental Status: Normal mood and affect. Normal behavior. Normal judgment and thought content.   Assessment and Plan:  Pregnancy: G4P1021 at [redacted]w[redacted]d  1. Encounter for supervision of other normal pregnancy, unspecified trimester - IOL on 02/20/18 at 0730  - BPP on 02/16/18  - IOL orders placed   Term labor symptoms and general obstetric precautions including but not limited to vaginal bleeding, contractions, leaking of fluid and fetal movement were reviewed in detail with the patient. Please refer to After Visit Summary for other counseling  recommendations.  Return in about 3 days (around 02/16/2018) for nst/bpp postdates.  Future Appointments  Date Time Provider [place]  02/17/2018 10:15 AM [facility] NST [place], CNM

## 2018-02-13 NOTE — Patient Instructions (Signed)
If you have not have the baby please come to the hospital for induction of labor 03/02/18 at 7:30 am

## 2018-02-13 NOTE — Telephone Encounter (Signed)
Preadmission screen  

## 2018-02-17 ENCOUNTER — Ambulatory Visit: Payer: Self-pay

## 2018-02-17 ENCOUNTER — Ambulatory Visit (INDEPENDENT_AMBULATORY_CARE_PROVIDER_SITE_OTHER): Payer: Self-pay | Admitting: *Deleted

## 2018-02-17 VITALS — BP 126/80 | HR 90

## 2018-02-17 DIAGNOSIS — O480 Post-term pregnancy: Secondary | ICD-10-CM

## 2018-02-17 NOTE — Progress Notes (Signed)
Pt informed that the ultrasound is considered a limited OB ultrasound and is not intended to be a complete ultrasound exam.  Patient also informed that the ultrasound is not being completed with the intent of assessing for fetal or placental anomalies or any pelvic abnormalities.  Explained that the purpose of today's ultrasound is to assess for presentation, BPP and amniotic fluid volume.  Patient acknowledges the purpose of the exam and the limitations of the study.    IOL is scheduled on 4/8

## 2018-02-20 ENCOUNTER — Inpatient Hospital Stay (HOSPITAL_COMMUNITY)
Admission: RE | Admit: 2018-02-20 | Discharge: 2018-02-24 | DRG: 786 | Disposition: A | Discharge status: Home / Self Care | Payer: Medicaid Other | Source: Ambulatory Visit | Attending: Family Medicine | Admitting: Family Medicine

## 2018-02-20 ENCOUNTER — Inpatient Hospital Stay (HOSPITAL_COMMUNITY): Payer: Medicaid Other | Admitting: Anesthesiology

## 2018-02-20 ENCOUNTER — Other Ambulatory Visit: Payer: Self-pay

## 2018-02-20 ENCOUNTER — Encounter (HOSPITAL_COMMUNITY): Payer: Self-pay

## 2018-02-20 DIAGNOSIS — O139 Gestational [pregnancy-induced] hypertension without significant proteinuria, unspecified trimester: Secondary | ICD-10-CM

## 2018-02-20 DIAGNOSIS — D573 Sickle-cell trait: Secondary | ICD-10-CM | POA: Diagnosis not present

## 2018-02-20 DIAGNOSIS — O904 Postpartum acute kidney failure: Secondary | ICD-10-CM | POA: Diagnosis not present

## 2018-02-20 DIAGNOSIS — N179 Acute kidney failure, unspecified: Secondary | ICD-10-CM | POA: Diagnosis not present

## 2018-02-20 DIAGNOSIS — O0933 Supervision of pregnancy with insufficient antenatal care, third trimester: Secondary | ICD-10-CM

## 2018-02-20 DIAGNOSIS — O480 Post-term pregnancy: Secondary | ICD-10-CM | POA: Insufficient documentation

## 2018-02-20 DIAGNOSIS — O9902 Anemia complicating childbirth: Secondary | ICD-10-CM | POA: Diagnosis present

## 2018-02-20 DIAGNOSIS — Z3A41 41 weeks gestation of pregnancy: Secondary | ICD-10-CM | POA: Diagnosis not present

## 2018-02-20 DIAGNOSIS — Z3480 Encounter for supervision of other normal pregnancy, unspecified trimester: Secondary | ICD-10-CM

## 2018-02-20 DIAGNOSIS — O134 Gestational [pregnancy-induced] hypertension without significant proteinuria, complicating childbirth: Secondary | ICD-10-CM | POA: Diagnosis present

## 2018-02-20 DIAGNOSIS — Z98891 History of uterine scar from previous surgery: Secondary | ICD-10-CM

## 2018-02-20 LAB — CBC
HEMATOCRIT: 32.1 % — AB (ref 36.0–46.0)
Hemoglobin: 11 g/dL — ABNORMAL LOW (ref 12.0–15.0)
MCH: 26.3 pg (ref 26.0–34.0)
MCHC: 34.3 g/dL (ref 30.0–36.0)
MCV: 76.6 fL — ABNORMAL LOW (ref 78.0–100.0)
Platelets: 270 10*3/uL (ref 150–400)
RBC: 4.19 MIL/uL (ref 3.87–5.11)
RDW: 15 % (ref 11.5–15.5)
WBC: 7.8 10*3/uL (ref 4.0–10.5)

## 2018-02-20 LAB — RPR: RPR Ser Ql: NONREACTIVE

## 2018-02-20 LAB — TYPE AND SCREEN
ABO/RH(D): B POS
Antibody Screen: NEGATIVE

## 2018-02-20 MED ORDER — OXYTOCIN 40 UNITS IN LACTATED RINGERS INFUSION - SIMPLE MED: 2.5000 [IU]/h | INTRAVENOUS | Status: DC

## 2018-02-20 MED ORDER — LACTATED RINGERS IV SOLN
INTRAVENOUS | Status: DC
  Administered 2018-02-20 – 2018-02-21 (×5): via INTRAVENOUS

## 2018-02-20 MED ORDER — FENTANYL CITRATE (PF) 100 MCG/2ML IJ SOLN: 50.0000 ug | INTRAMUSCULAR | Status: DC | PRN

## 2018-02-20 MED ORDER — FENTANYL 2.5 MCG/ML BUPIVACAINE 1/10 % EPIDURAL INFUSION (WH - ANES)
14.0000 mL/h | INTRAMUSCULAR | Status: DC | PRN
  Administered 2018-02-20 – 2018-02-21 (×3): 14 mL/h via EPIDURAL
  Filled 2018-02-20 (×3): qty 100

## 2018-02-20 MED ORDER — OXYCODONE-ACETAMINOPHEN 5-325 MG PO TABS: 2.0000 | ORAL_TABLET | ORAL | Status: DC | PRN

## 2018-02-20 MED ORDER — SOD CITRATE-CITRIC ACID 500-334 MG/5ML PO SOLN
30.0000 mL | ORAL | Status: DC | PRN
  Administered 2018-02-21: 30 mL via ORAL
  Filled 2018-02-20: qty 15

## 2018-02-20 MED ORDER — LIDOCAINE HCL (PF) 1 % IJ SOLN
INTRAMUSCULAR | Status: DC | PRN
  Administered 2018-02-20 (×2): 4 mL

## 2018-02-20 MED ORDER — LACTATED RINGERS IV SOLN
500.0000 mL | INTRAVENOUS | Status: DC | PRN
  Administered 2018-02-20: 1000 mL via INTRAVENOUS
  Administered 2018-02-20 (×2): 500 mL via INTRAVENOUS

## 2018-02-20 MED ORDER — EPHEDRINE 5 MG/ML INJ: 10.0000 mg | INTRAVENOUS | Status: DC | PRN

## 2018-02-20 MED ORDER — LIDOCAINE HCL (PF) 1 % IJ SOLN: 30.0000 mL | INTRAMUSCULAR | Status: DC | PRN

## 2018-02-20 MED ORDER — MISOPROSTOL 50MCG HALF TABLET
50.0000 ug | ORAL_TABLET | ORAL | Status: DC | PRN
  Administered 2018-02-20: 50 ug via BUCCAL
  Filled 2018-02-20: qty 1

## 2018-02-20 MED ORDER — OXYTOCIN 40 UNITS IN LACTATED RINGERS INFUSION - SIMPLE MED
1.0000 m[IU]/min | INTRAVENOUS | Status: DC
  Administered 2018-02-20: 1 m[IU]/min via INTRAVENOUS
  Filled 2018-02-20: qty 1000

## 2018-02-20 MED ORDER — OXYTOCIN BOLUS FROM INFUSION: 500.0000 mL | Freq: Once | INTRAVENOUS | Status: DC

## 2018-02-20 MED ORDER — ONDANSETRON HCL 4 MG/2ML IJ SOLN
4.0000 mg | Freq: Four times a day (QID) | INTRAMUSCULAR | Status: DC | PRN
  Administered 2018-02-20: 4 mg via INTRAVENOUS
  Filled 2018-02-20 (×2): qty 2

## 2018-02-20 MED ORDER — TERBUTALINE SULFATE 1 MG/ML IJ SOLN
0.2500 mg | Freq: Once | INTRAMUSCULAR | Status: AC | PRN
  Administered 2018-02-21: 0.25 mg via SUBCUTANEOUS
  Filled 2018-02-20: qty 1

## 2018-02-20 MED ORDER — FENTANYL 2.5 MCG/ML BUPIVACAINE 1/10 % EPIDURAL INFUSION (WH - ANES): 14.0000 mL/h | INTRAMUSCULAR | Status: DC | PRN

## 2018-02-20 MED ORDER — PHENYLEPHRINE 40 MCG/ML (10ML) SYRINGE FOR IV PUSH (FOR BLOOD PRESSURE SUPPORT)
80.0000 ug | PREFILLED_SYRINGE | INTRAVENOUS | Status: DC | PRN
  Administered 2018-02-21: 80 ug via INTRAVENOUS

## 2018-02-20 MED ORDER — LACTATED RINGERS IV SOLN
INTRAVENOUS | Status: DC
  Administered 2018-02-20 – 2018-02-21 (×3): via INTRAUTERINE

## 2018-02-20 MED ORDER — OXYCODONE-ACETAMINOPHEN 5-325 MG PO TABS: 1.0000 | ORAL_TABLET | ORAL | Status: DC | PRN

## 2018-02-20 MED ORDER — PHENYLEPHRINE 40 MCG/ML (10ML) SYRINGE FOR IV PUSH (FOR BLOOD PRESSURE SUPPORT)
80.0000 ug | PREFILLED_SYRINGE | INTRAVENOUS | Status: DC | PRN
  Filled 2018-02-20 (×2): qty 10

## 2018-02-20 MED ORDER — ACETAMINOPHEN 325 MG PO TABS: 650.0000 mg | ORAL_TABLET | ORAL | Status: DC | PRN

## 2018-02-20 MED ORDER — DIPHENHYDRAMINE HCL 50 MG/ML IJ SOLN: 12.5000 mg | INTRAMUSCULAR | Status: DC | PRN

## 2018-02-20 MED ORDER — LACTATED RINGERS IV SOLN
500.0000 mL | Freq: Once | INTRAVENOUS | Status: AC
  Administered 2018-02-21: 500 mL via INTRAVENOUS

## 2018-02-20 NOTE — H&P (Signed)
LABOR AND DELIVERY ADMISSION HISTORY AND PHYSICAL NOTE  Casey Sullivan is a 32 y.o. female [id number] with IUP at [redacted]w[redacted]d by LMP presenting for IOL for postdates. She denies any concerns. She reports positive fetal movement. She denies leakage of fluid or vaginal bleeding.  Prenatal History/Complications: PNC at [facility] Pregnancy complications:  - Limited PNC (no care from October thru February_ - Sickle cell trait  Past Medical History: Past Medical History:  Diagnosis Date  . Sickle cell trait [facility])     Past Surgical History: Past Surgical History:  Procedure Laterality Date  . NO PAST SURGERIES    . OVUM / OOCYTE RETRIEVAL     egg donation    Obstetrical History: OB History    Gravida  4   Para  1   Term  1   Preterm      AB  2   Living  1     SAB      TAB  2   Ectopic      Multiple  0   Live Births  1           Social History: Social History   Socioeconomic History  . Marital status: Married    Spouse name: Not on file  . Number of children: Not on file  . Years of education: Not on file  . Highest education level: Not on file  Occupational History  . Not on file  Social Needs  . Financial resource strain: Not on file  . Food insecurity:    Worry: Not on file    Inability: Not on file  . Transportation needs:    Medical: Not on file    Non-medical: Not on file  Tobacco Use  . Smoking status: Never Smoker  . Smokeless tobacco: Never Used  Substance and Sexual Activity  . Alcohol use: Yes    Comment: occasioanl, none with preg  . Drug use: No  . Sexual activity: Yes    Birth control/protection: None  Lifestyle  . Physical activity:    Days per week: Not on file    Minutes per session: Not on file  . Stress: Not on file  Relationships  . Social connections:    Talks on phone: Not on file    Gets together: Not on file    Attends religious service: Not on file    Active member of club or organization: Not on file    Attends meetings  of clubs or organizations: Not on file    Relationship status: Not on file  Other Topics Concern  . Not on file  Social History Narrative  . Not on file    Family History: Family History  Problem Relation Age of Onset  . Stroke Father     Allergies: No Known Allergies  Medications Prior to Admission  Medication Sig Dispense Refill Last Dose  . Prenatal Vit-Fe Fumarate-FA (PRENATAL VITAMINS PLUS) 27-1 MG TABS Take 1 tablet by mouth 1 day or 1 dose. (Patient taking differently: Take 1 tablet by mouth daily. ) 30 tablet 3 02/20/2018 at Unknown time  . Elastic Bandages & Supports (COMFORT FIT MATERNITY SUPP SM) MISC 1 Device by Does not apply route daily. 1 each 0 Taking     Review of Systems  All systems reviewed and negative except as stated in HPI  Physical Exam Blood pressure 129/77, pulse (!) 111, temperature 98.5 F (36.9 C), temperature source Oral, resp. rate 16, height 5\' 6"  (1.676 m), weight  217 lb 6.4 oz (98.6 kg), last menstrual period 05/09/2017, unknown if currently breastfeeding. General appearance: alert, oriented, NAD Lungs: normal respiratory effort Heart: regular rate Abdomen: soft, non-tender; gravid, FH appropriate for GA Extremities: No calf swelling or tenderness Presentation: cephalic by RN cervical exam Fetal monitoring: baseline rate 145, intermittent minimal/moderate variability, +acel, no decel Uterine activity: occasional rare contraction SVE: Dilation: 1.5 Effacement (%): 60, 70 Station: -3 Exam by:: Ignacia Felling, RN  Prenatal labs: ABO, Rh: B/Positive/-- (10/02 1120) Antibody: Negative (10/02 1120) Rubella: 23.40 (10/02 1120) RPR: Non Reactive (10/02 1120)  HBsAg: Negative (10/02 1120)  HIV: Non Reactive (10/02 1120)  GC/Chlamydia: negative GBS: Negative (03/04 0000)  2-hr GTT: Normal Genetic screening:  Normal Quad screen Anatomy US: bilateral choroid plexus cysts, and nonpathologic EIF noted at 19 weeks; resolved/normal scan at 23  weeks  Prenatal Transfer Tool  Maternal Diabetes: No Genetic Screening: Normal Maternal Ultrasounds/Referrals: Abnormal:  Findings:   Other: bilateral choroid plexus cysts, and nonpathologic EIF (both resolved on follow up scan) Fetal Ultrasounds or other Referrals:  None Maternal Substance Abuse:  No Significant Maternal Medications:  None Significant Maternal Lab Results: Lab values include: Group B Strep negative  Results for orders placed or performed during the hospital encounter of 02/20/18 (from the past 24 hour(s))  CBC   Collection Time: 02/20/18  8:30 AM  Result Value Ref Range   WBC 7.8 4.0 - 10.5 K/uL   RBC 4.19 3.87 - 5.11 MIL/uL   Hemoglobin 11.0 (L) 12.0 - 15.0 g/dL   HCT 32.1 (L) 36.0 - 46.0 %   MCV 76.6 (L) 78.0 - 100.0 fL   MCH 26.3 26.0 - 34.0 pg   MCHC 34.3 30.0 - 36.0 g/dL   RDW 15.0 11.5 - 15.5 %   Platelets 270 150 - 400 K/uL    Assessment: Casey Sullivan is a 32 y.o. [id number] at [redacted]w[redacted]d here for IOL for postdates  #Labor: cytotec for cervical ripening; discussed FB, deferred at this time #Pain: Per patient's request #FWB: Cat II; IVF bolus for decreased varibility #ID:  GBS neg #MOF: both breast and bottle #MOC: POPs  [name] 02/20/2018, 9:27 AM

## 2018-02-20 NOTE — Anesthesia Procedure Notes (Signed)
Epidural Patient location during procedure: OB Start time: 02/20/2018 4:28 PM End time: 02/20/2018 4:38 PM  Staffing Anesthesiologist: Nolon Nations, MD Performed: anesthesiologist   Preanesthetic Checklist Completed: patient identified, pre-op evaluation, timeout performed, IV checked, risks and benefits discussed and monitors and equipment checked  Epidural Patient position: sitting Prep: site prepped and draped and DuraPrep Patient monitoring: heart rate, continuous pulse ox and blood pressure Approach: midline Location: L3-L4 Injection technique: LOR air and LOR saline  Needle:  Needle type: Tuohy  Needle gauge: 17 G Needle length: 9 cm Needle insertion depth: 6 cm Catheter type: closed end flexible Catheter size: 19 Gauge Catheter at skin depth: 11 cm Test dose: negative  Assessment Sensory level: T8 Events: blood not aspirated, injection not painful, no injection resistance, negative IV test and no paresthesia  Additional Notes Reason for block:procedure for pain

## 2018-02-20 NOTE — Anesthesia Pain Management Evaluation Note (Signed)
  CRNA Pain Management Visit Note  Patient: Casey Sullivan, 32 y.o., female  "Hello I am a member of the anesthesia team at [facility]. We have an anesthesia team available at all times to provide care throughout the hospital, including epidural management and anesthesia for C-section. I don't know your plan for the delivery whether it a natural birth, water birth, IV sedation, nitrous supplementation, doula or epidural, but we want to meet your pain goals."   1.Was your pain managed to your expectations on prior hospitalizations?   No prior hospitalizations  2.What is your expectation for pain management during this hospitalization?     Epidural  3.How can we help you reach that goal? unsure  Record the patient's initial score and the patient's pain goal.   Pain: 0  Pain Goal: 4 [facility] wants you to be able to say your pain was always managed very well.  Casimer Lanius 02/20/2018

## 2018-02-20 NOTE — Progress Notes (Addendum)
Patient here for postdate NST NST reviewed and non- reactive BPP 8/10 Patient scheduled for IOL on 02/20/2018

## 2018-02-20 NOTE — Anesthesia Preprocedure Evaluation (Signed)
Anesthesia Evaluation  Patient identified by MRN, date of birth, ID band Patient awake    Reviewed: Allergy & Precautions, H&P , Patient's Chart, lab work & pertinent test results  Airway Mallampati: II  TM Distance: >3 FB Neck ROM: full    Dental  (+) Teeth Intact   Pulmonary    breath sounds clear to auscultation       Cardiovascular  Rhythm:regular Rate:Normal     Neuro/Psych    GI/Hepatic   Endo/Other    Renal/GU      Musculoskeletal   Abdominal (+) + obese,   Peds  Hematology  (+) Sickle cell trait ,   Anesthesia Other Findings       Reproductive/Obstetrics (+) Pregnancy                             Anesthesia Physical  Anesthesia Plan  ASA: II  Anesthesia Plan: Epidural   Post-op Pain Management:    Induction:   PONV Risk Score and Plan:   Airway Management Planned:   Additional Equipment:   Intra-op Plan:   Post-operative Plan:   Informed Consent: I have reviewed the patients History and Physical, chart, labs and discussed the procedure including the risks, benefits and alternatives for the proposed anesthesia with the patient or authorized representative who has indicated his/her understanding and acceptance.   Dental Advisory Given  Plan Discussed with:   Anesthesia Plan Comments:         Anesthesia Quick Evaluation

## 2018-02-20 NOTE — Progress Notes (Signed)
LABOR PROGRESS NOTE  Casey Sullivan is a 32 y.o. G4P1021 at [redacted]w[redacted]d  admitted for IOL for postdates  Subjective: Patient now more comfortable with epidural.   Objective: BP 124/86   Pulse 79   Temp 98.3 F (36.8 C) (Oral)   Resp 18   Ht 5\' 6"  (1.676 m)   Wt 217 lb 6.4 oz (98.6 kg)   LMP 05/09/2017 (Exact Date)   BMI 35.09 kg/m  or  Vitals:   02/20/18 1655 02/20/18 1700 02/20/18 1730 02/20/18 1800  BP: 123/80 125/82 108/62 124/86  Pulse: 74 80 71 79  Resp: 18 18 16 18   Temp:  98.3 F (36.8 C)    TempSrc:  Oral    Weight:      Height:        SVE: 4.5cm/80%/-2 FHT: baseline rate 150, moderate variability , +acel, variable decels  Toco: MVUs ~160-180   Assessment / Plan: 32 y.o. [id number] at [redacted]w[redacted]d here for IOL for postdates  Labor: MVUs not adequate. Start IV Pitocin Fetal Wellbeing:  Cat II. Amnioinfusion. FSE placed GBS status: negative Pain Control:  Well-controlled with epidural Anticipated MOD:  SVD  Gailen Shelter, MD 02/20/2018, 6:31 PM

## 2018-02-20 NOTE — Progress Notes (Signed)
LABOR PROGRESS NOTE  Casey Sullivan is a 32 y.o. G4P1021 at [redacted]w[redacted]d  admitted for IOL for postdates  Subjective: Patient reports she is starting to feel contractions more frequently and stronger  Objective: BP 127/79   Pulse 90   Temp 97.9 F (36.6 C) (Oral)   Resp 16   Ht 5\' 6"  (1.676 m)   Wt 217 lb 6.4 oz (98.6 kg)   LMP 05/09/2017 (Exact Date)   BMI 35.09 kg/m  or  Vitals:   02/20/18 0818 02/20/18 1154  BP: 129/77 127/79  Pulse: (!) 111 90  Resp: 16 16  Temp: 98.5 F (36.9 C) 97.9 F (36.6 C)  TempSrc: Oral Oral  Weight: 217 lb 6.4 oz (98.6 kg)   Height: 5\' 6"  (1.676 m)     SVE: 2.5cm/60%/-2 FHT: baseline rate 150, moderate variability (~5-10), +acel, variable decels (had 2 earlier decels, that appear late, but has improved with IVFs) Toco: ctx q~3 min   Assessment / Plan: 32 y.o. [id number] at [redacted]w[redacted]d here for IOL for postdates  Labor: s/p cytotec x1. Bishop score 7, starting to contract more regularly. Expectant management for now, and if not progressing will start Pitocin Fetal Wellbeing:  Cat II GBS status: negative Pain Control:  Per patient's request. Planning on epidural Anticipated MOD:  SVD  Gailen Shelter, MD 02/20/2018, 1:17 PM

## 2018-02-20 NOTE — Progress Notes (Addendum)
FHT with intermittent minimal variability, some variable decels 1L IVF being given.   SVE 3cm/70%/-2 AROM, clear fluid. IUPC placed. Will start amnioinfusion to help with variable decels Plan to start Pitocin if contractions not adequate.   Almyra Free P. Degele, MD OB Fellow

## 2018-02-21 ENCOUNTER — Encounter (HOSPITAL_COMMUNITY): Payer: Self-pay

## 2018-02-21 ENCOUNTER — Encounter (HOSPITAL_COMMUNITY)
Admission: RE | Disposition: A | Discharge status: Home / Self Care | Payer: Self-pay | Source: Ambulatory Visit | Attending: Family Medicine

## 2018-02-21 DIAGNOSIS — O480 Post-term pregnancy: Secondary | ICD-10-CM

## 2018-02-21 DIAGNOSIS — Z3A41 41 weeks gestation of pregnancy: Secondary | ICD-10-CM

## 2018-02-21 LAB — COMPREHENSIVE METABOLIC PANEL
ALK PHOS: 193 U/L — AB (ref 38–126)
ALT: 15 U/L (ref 14–54)
ANION GAP: 14 (ref 5–15)
AST: 22 U/L (ref 15–41)
Albumin: 2.8 g/dL — ABNORMAL LOW (ref 3.5–5.0)
BILIRUBIN TOTAL: 0.7 mg/dL (ref 0.3–1.2)
BUN: 11 mg/dL (ref 6–20)
CALCIUM: 9.2 mg/dL (ref 8.9–10.3)
CO2: 19 mmol/L — AB (ref 22–32)
Chloride: 102 mmol/L (ref 101–111)
Creatinine, Ser: 1.06 mg/dL — ABNORMAL HIGH (ref 0.44–1.00)
GFR calc non Af Amer: >60 mL/min (ref >60)
Glucose, Bld: 105 mg/dL — ABNORMAL HIGH (ref 65–99)
POTASSIUM: 4.5 mmol/L (ref 3.5–5.1)
Sodium: 135 mmol/L (ref 135–145)
TOTAL PROTEIN: 6.5 g/dL (ref 6.5–8.1)

## 2018-02-21 LAB — CBC
HCT: 34.9 % — ABNORMAL LOW (ref 36.0–46.0)
HEMOGLOBIN: 11.9 g/dL — AB (ref 12.0–15.0)
MCH: 26.4 pg (ref 26.0–34.0)
MCHC: 34.1 g/dL (ref 30.0–36.0)
MCV: 77.6 fL — ABNORMAL LOW (ref 78.0–100.0)
Platelets: 258 10*3/uL (ref 150–400)
RBC: 4.5 MIL/uL (ref 3.87–5.11)
RDW: 15.3 % (ref 11.5–15.5)
WBC: 18.9 10*3/uL — ABNORMAL HIGH (ref 4.0–10.5)

## 2018-02-21 LAB — PROTEIN / CREATININE RATIO, URINE
Creatinine, Urine: 75 mg/dL
PROTEIN CREATININE RATIO: 0.27 mg/mg{creat} — AB (ref 0.00–0.15)
TOTAL PROTEIN, URINE: 20 mg/dL

## 2018-02-21 SURGERY — Surgical Case
Anesthesia: Epidural

## 2018-02-21 MED ORDER — LIDOCAINE-EPINEPHRINE (PF) 2 %-1:200000 IJ SOLN
INTRAMUSCULAR | Status: AC
  Filled 2018-02-21: qty 20

## 2018-02-21 MED ORDER — MENTHOL 3 MG MT LOZG: 1.0000 | LOZENGE | OROMUCOSAL | Status: DC | PRN

## 2018-02-21 MED ORDER — NALOXONE HCL 4 MG/10ML IJ SOLN: 1.0000 ug/kg/h | INTRAMUSCULAR | Status: DC | PRN

## 2018-02-21 MED ORDER — MORPHINE SULFATE (PF) 0.5 MG/ML IJ SOLN
INTRAMUSCULAR | Status: AC
  Filled 2018-02-21: qty 10

## 2018-02-21 MED ORDER — LIDOCAINE-EPINEPHRINE (PF) 2 %-1:200000 IJ SOLN
INTRAMUSCULAR | Status: DC | PRN
  Administered 2018-02-21: 5 mL via EPIDURAL
  Administered 2018-02-21: 3 mL via EPIDURAL
  Administered 2018-02-21: 5 mL via EPIDURAL

## 2018-02-21 MED ORDER — OXYTOCIN 40 UNITS IN LACTATED RINGERS INFUSION - SIMPLE MED: 2.5000 [IU]/h | INTRAVENOUS | Status: AC

## 2018-02-21 MED ORDER — CEFAZOLIN SODIUM-DEXTROSE 2-4 GM/100ML-% IV SOLN
2.0000 g | Freq: Three times a day (TID) | INTRAVENOUS | Status: DC
  Filled 2018-02-21: qty 100

## 2018-02-21 MED ORDER — SCOPOLAMINE 1 MG/3DAYS TD PT72
1.0000 | MEDICATED_PATCH | TRANSDERMAL | Status: DC
  Administered 2018-02-21: 1.5 mg via TRANSDERMAL
  Filled 2018-02-21: qty 1

## 2018-02-21 MED ORDER — COCONUT OIL OIL
1.0000 "application " | TOPICAL_OIL | Status: DC | PRN
  Filled 2018-02-21: qty 120

## 2018-02-21 MED ORDER — SODIUM BICARBONATE 8.4 % IV SOLN
INTRAVENOUS | Status: DC | PRN
  Administered 2018-02-21: 5 mL via EPIDURAL

## 2018-02-21 MED ORDER — PHENYLEPHRINE 40 MCG/ML (10ML) SYRINGE FOR IV PUSH (FOR BLOOD PRESSURE SUPPORT)
80.0000 ug | PREFILLED_SYRINGE | Freq: Once | INTRAVENOUS | Status: DC
  Filled 2018-02-21: qty 5

## 2018-02-21 MED ORDER — SCOPOLAMINE 1 MG/3DAYS TD PT72
MEDICATED_PATCH | TRANSDERMAL | Status: AC
  Administered 2018-02-21: 1.5 mg via TRANSDERMAL
  Filled 2018-02-21: qty 1

## 2018-02-21 MED ORDER — OXYCODONE HCL 5 MG PO TABS
10.0000 mg | ORAL_TABLET | ORAL | Status: DC | PRN
  Administered 2018-02-22 – 2018-02-24 (×8): 10 mg via ORAL
  Filled 2018-02-21 (×8): qty 2

## 2018-02-21 MED ORDER — DIPHENHYDRAMINE HCL 50 MG/ML IJ SOLN
12.5000 mg | INTRAMUSCULAR | Status: DC | PRN
Start: 2018-02-21 — End: 2018-02-24

## 2018-02-21 MED ORDER — DIPHENHYDRAMINE HCL 25 MG PO CAPS: 25.0000 mg | ORAL_CAPSULE | Freq: Four times a day (QID) | ORAL | Status: DC | PRN

## 2018-02-21 MED ORDER — FENTANYL CITRATE (PF) 100 MCG/2ML IJ SOLN
INTRAMUSCULAR | Status: AC
  Filled 2018-02-21: qty 2

## 2018-02-21 MED ORDER — FENTANYL CITRATE (PF) 100 MCG/2ML IJ SOLN: 25.0000 ug | INTRAMUSCULAR | Status: DC | PRN

## 2018-02-21 MED ORDER — SODIUM CHLORIDE 0.9 % IV SOLN
2.0000 g | Freq: Four times a day (QID) | INTRAVENOUS | Status: DC
  Administered 2018-02-21 – 2018-02-22 (×3): 2 g via INTRAVENOUS
  Filled 2018-02-21: qty 2
  Filled 2018-02-21: qty 2000
  Filled 2018-02-21: qty 2
  Filled 2018-02-21: qty 2000
  Filled 2018-02-21: qty 2

## 2018-02-21 MED ORDER — ENOXAPARIN SODIUM 40 MG/0.4ML ~~LOC~~ SOLN
40.0000 mg | SUBCUTANEOUS | Status: DC
  Administered 2018-02-22 – 2018-02-24 (×3): 40 mg via SUBCUTANEOUS
  Filled 2018-02-21 (×3): qty 0.4

## 2018-02-21 MED ORDER — SIMETHICONE 80 MG PO CHEW: 80.0000 mg | CHEWABLE_TABLET | ORAL | Status: DC | PRN

## 2018-02-21 MED ORDER — LACTATED RINGERS IV SOLN
INTRAVENOUS | Status: DC | PRN
  Administered 2018-02-21: 15:00:00 via INTRAVENOUS

## 2018-02-21 MED ORDER — SODIUM CHLORIDE 0.9 % IV SOLN
500.0000 mg | Freq: Once | INTRAVENOUS | Status: AC
  Administered 2018-02-21: 500 mg via INTRAVENOUS
  Filled 2018-02-21: qty 500

## 2018-02-21 MED ORDER — DIBUCAINE 1 % RE OINT: 1.0000 "application " | TOPICAL_OINTMENT | RECTAL | Status: DC | PRN

## 2018-02-21 MED ORDER — DIPHENHYDRAMINE HCL 25 MG PO CAPS: 25.0000 mg | ORAL_CAPSULE | ORAL | Status: DC | PRN

## 2018-02-21 MED ORDER — TETANUS-DIPHTH-ACELL PERTUSSIS 5-2.5-18.5 LF-MCG/0.5 IM SUSP: 0.5000 mL | Freq: Once | INTRAMUSCULAR | Status: DC

## 2018-02-21 MED ORDER — WITCH HAZEL-GLYCERIN EX PADS: 1.0000 "application " | MEDICATED_PAD | CUTANEOUS | Status: DC | PRN

## 2018-02-21 MED ORDER — NALBUPHINE HCL 10 MG/ML IJ SOLN: 5.0000 mg | Freq: Once | INTRAMUSCULAR | Status: DC | PRN

## 2018-02-21 MED ORDER — MORPHINE SULFATE (PF) 0.5 MG/ML IJ SOLN
INTRAMUSCULAR | Status: DC | PRN
  Administered 2018-02-21: 4 mg via EPIDURAL
  Administered 2018-02-21: 1 mg via INTRAVENOUS

## 2018-02-21 MED ORDER — CEFAZOLIN SODIUM-DEXTROSE 2-3 GM-%(50ML) IV SOLR
INTRAVENOUS | Status: DC | PRN
  Administered 2018-02-21: 2 g via INTRAVENOUS

## 2018-02-21 MED ORDER — OXYTOCIN 10 UNIT/ML IJ SOLN
INTRAVENOUS | Status: DC | PRN
  Administered 2018-02-21: 40 [IU] via INTRAVENOUS

## 2018-02-21 MED ORDER — ONDANSETRON HCL 4 MG/2ML IJ SOLN
INTRAMUSCULAR | Status: AC
Start: 2018-02-21 — End: 2018-02-21
  Filled 2018-02-21: qty 2

## 2018-02-21 MED ORDER — MEPERIDINE HCL 25 MG/ML IJ SOLN: 6.2500 mg | INTRAMUSCULAR | Status: DC | PRN

## 2018-02-21 MED ORDER — CEFAZOLIN SODIUM-DEXTROSE 2-4 GM/100ML-% IV SOLN
INTRAVENOUS | Status: AC
  Filled 2018-02-21: qty 100

## 2018-02-21 MED ORDER — SIMETHICONE 80 MG PO CHEW
80.0000 mg | CHEWABLE_TABLET | Freq: Three times a day (TID) | ORAL | Status: DC
  Administered 2018-02-21 – 2018-02-24 (×8): 80 mg via ORAL
  Filled 2018-02-21 (×8): qty 1

## 2018-02-21 MED ORDER — FENTANYL CITRATE (PF) 100 MCG/2ML IJ SOLN
INTRAMUSCULAR | Status: DC | PRN
  Administered 2018-02-21: 100 ug via INTRAVENOUS

## 2018-02-21 MED ORDER — ZOLPIDEM TARTRATE 5 MG PO TABS: 5.0000 mg | ORAL_TABLET | Freq: Every evening | ORAL | Status: DC | PRN

## 2018-02-21 MED ORDER — NALBUPHINE HCL 10 MG/ML IJ SOLN
5.0000 mg | INTRAMUSCULAR | Status: DC | PRN
  Administered 2018-02-22: 5 mg via SUBCUTANEOUS
  Filled 2018-02-21: qty 1

## 2018-02-21 MED ORDER — SODIUM CHLORIDE 0.9 % IR SOLN
Status: DC | PRN
  Administered 2018-02-21: 300 mL

## 2018-02-21 MED ORDER — LACTATED RINGERS IV SOLN
INTRAVENOUS | Status: DC
  Administered 2018-02-22: 03:00:00 via INTRAVENOUS

## 2018-02-21 MED ORDER — GENTAMICIN SULFATE 40 MG/ML IJ SOLN
150.0000 mg | Freq: Three times a day (TID) | INTRAVENOUS | Status: DC
  Administered 2018-02-21 – 2018-02-22 (×3): 150 mg via INTRAVENOUS
  Filled 2018-02-21 (×4): qty 3.75

## 2018-02-21 MED ORDER — KETOROLAC TROMETHAMINE 30 MG/ML IJ SOLN
30.0000 mg | Freq: Once | INTRAMUSCULAR | Status: AC
  Administered 2018-02-21: 30 mg via INTRAVENOUS
  Filled 2018-02-21: qty 1

## 2018-02-21 MED ORDER — OXYCODONE HCL 5 MG PO TABS
5.0000 mg | ORAL_TABLET | ORAL | Status: DC | PRN
  Administered 2018-02-22 – 2018-02-23 (×2): 5 mg via ORAL
  Filled 2018-02-21 (×2): qty 1

## 2018-02-21 MED ORDER — LACTATED RINGERS IV SOLN
INTRAVENOUS | Status: DC | PRN
  Administered 2018-02-21 (×2): via INTRAVENOUS

## 2018-02-21 MED ORDER — ACETAMINOPHEN 325 MG PO TABS
650.0000 mg | ORAL_TABLET | ORAL | Status: DC | PRN
  Administered 2018-02-22 – 2018-02-24 (×7): 650 mg via ORAL
  Filled 2018-02-21 (×7): qty 2

## 2018-02-21 MED ORDER — PRENATAL MULTIVITAMIN CH
1.0000 | ORAL_TABLET | Freq: Every day | ORAL | Status: DC
  Administered 2018-02-22 – 2018-02-24 (×3): 1 via ORAL
  Filled 2018-02-21 (×3): qty 1

## 2018-02-21 MED ORDER — IBUPROFEN 600 MG PO TABS
600.0000 mg | ORAL_TABLET | Freq: Four times a day (QID) | ORAL | Status: DC
  Administered 2018-02-22 (×2): 600 mg via ORAL
  Filled 2018-02-21 (×2): qty 1

## 2018-02-21 MED ORDER — SIMETHICONE 80 MG PO CHEW
80.0000 mg | CHEWABLE_TABLET | ORAL | Status: DC
  Administered 2018-02-22 – 2018-02-23 (×3): 80 mg via ORAL
  Filled 2018-02-21 (×3): qty 1

## 2018-02-21 MED ORDER — ONDANSETRON HCL 4 MG/2ML IJ SOLN
INTRAMUSCULAR | Status: DC | PRN
  Administered 2018-02-21: 4 mg via INTRAVENOUS

## 2018-02-21 MED ORDER — NALBUPHINE HCL 10 MG/ML IJ SOLN: 5.0000 mg | INTRAMUSCULAR | Status: DC | PRN

## 2018-02-21 MED ORDER — SENNOSIDES-DOCUSATE SODIUM 8.6-50 MG PO TABS
2.0000 | ORAL_TABLET | ORAL | Status: DC
  Administered 2018-02-22 – 2018-02-23 (×3): 2 via ORAL
  Filled 2018-02-21 (×3): qty 2

## 2018-02-21 MED ORDER — SODIUM BICARBONATE 8.4 % IV SOLN
INTRAVENOUS | Status: AC
  Filled 2018-02-21: qty 50

## 2018-02-21 MED ORDER — OXYTOCIN 10 UNIT/ML IJ SOLN
INTRAMUSCULAR | Status: AC
  Filled 2018-02-21: qty 4

## 2018-02-21 MED ORDER — SCOPOLAMINE 1 MG/3DAYS TD PT72: 1.0000 | MEDICATED_PATCH | Freq: Once | TRANSDERMAL | Status: DC

## 2018-02-21 SURGICAL SUPPLY — 36 items
BENZOIN TINCTURE PRP APPL 2/3 (GAUZE/BANDAGES/DRESSINGS) ×3 IMPLANT
CHLORAPREP W/TINT 26ML (MISCELLANEOUS) ×3 IMPLANT
CLAMP CORD UMBIL (MISCELLANEOUS) IMPLANT
CLOSURE WOUND 1/2 X4 (GAUZE/BANDAGES/DRESSINGS) ×1
CLOTH BEACON ORANGE TIMEOUT ST (SAFETY) ×3 IMPLANT
DRSG OPSITE POSTOP 4X10 (GAUZE/BANDAGES/DRESSINGS) ×3 IMPLANT
ELECT REM PT RETURN 9FT ADLT (ELECTROSURGICAL) ×3
ELECTRODE REM PT RTRN 9FT ADLT (ELECTROSURGICAL) ×1 IMPLANT
EXTRACTOR VACUUM BELL STYLE (SUCTIONS) ×3 IMPLANT
EXTRACTOR VACUUM M CUP 4 TUBE (SUCTIONS) IMPLANT
EXTRACTOR VACUUM M CUP 4' TUBE (SUCTIONS)
GAUZE SPONGE 4X4 12PLY STRL LF (GAUZE/BANDAGES/DRESSINGS) ×6 IMPLANT
GLOVE BIOGEL PI IND STRL 7.0 (GLOVE) ×2 IMPLANT
GLOVE BIOGEL PI IND STRL 7.5 (GLOVE) ×2 IMPLANT
GLOVE BIOGEL PI INDICATOR 7.0 (GLOVE) ×4
GLOVE BIOGEL PI INDICATOR 7.5 (GLOVE) ×4
GLOVE ECLIPSE 7.5 STRL STRAW (GLOVE) ×3 IMPLANT
GOWN STRL REUS W/TWL LRG LVL3 (GOWN DISPOSABLE) ×9 IMPLANT
KIT ABG SYR 3ML LUER SLIP (SYRINGE) ×3 IMPLANT
NEEDLE HYPO 25X5/8 SAFETYGLIDE (NEEDLE) ×3 IMPLANT
NS IRRIG 1000ML POUR BTL (IV SOLUTION) ×3 IMPLANT
PACK C SECTION WH (CUSTOM PROCEDURE TRAY) ×3 IMPLANT
PAD ABD 7.5X8 STRL (GAUZE/BANDAGES/DRESSINGS) ×3 IMPLANT
PAD OB MATERNITY 4.3X12.25 (PERSONAL CARE ITEMS) ×3 IMPLANT
PENCIL BUTTON HOLSTER BLD 10FT (ELECTRODE) ×3 IMPLANT
PENCIL SMOKE EVAC W/HOLSTER (ELECTROSURGICAL) ×3 IMPLANT
RTRCTR C-SECT PINK 25CM LRG (MISCELLANEOUS) ×3 IMPLANT
STRIP CLOSURE SKIN 1/2X4 (GAUZE/BANDAGES/DRESSINGS) ×2 IMPLANT
SUT VIC AB 0 CTX 36 (SUTURE) ×6
SUT VIC AB 0 CTX36XBRD ANBCTRL (SUTURE) ×3 IMPLANT
SUT VIC AB 2-0 CT1 27 (SUTURE) ×2
SUT VIC AB 2-0 CT1 TAPERPNT 27 (SUTURE) ×1 IMPLANT
SUT VIC AB 4-0 KS 27 (SUTURE) ×3 IMPLANT
TAPE CLOTH SURG 4X10 WHT LF (GAUZE/BANDAGES/DRESSINGS) ×3 IMPLANT
TOWEL OR 17X24 6PK STRL BLUE (TOWEL DISPOSABLE) ×3 IMPLANT
TRAY FOLEY BAG SILVER LF 14FR (SET/KITS/TRAYS/PACK) ×3 IMPLANT

## 2018-02-21 NOTE — Progress Notes (Signed)
Dr Lambert Keto notified of severe range BP. Pt shaking and maternal heart rate elevated.  Unsure of accuracy of BP reading.  Will reevaluate before beginning preeclampsia focused order set for labetalol.

## 2018-02-21 NOTE — Transfer of Care (Signed)
Immediate Anesthesia Transfer of Care Note  Patient: Casey Sullivan  Procedure(s) Performed: CESAREAN SECTION (N/A )  Patient Location: PACU  Anesthesia Type:Epidural  Level of Consciousness: awake, alert  and oriented  Airway & Oxygen Therapy: Patient Spontanous Breathing  Post-op Assessment: Report given to RN and Post -op Vital signs reviewed and stable  Post vital signs: Reviewed and stable  Last Vitals:  Vitals Value Taken Time  BP 133/107 02/21/2018  3:31 PM  Temp    Pulse 124 02/21/2018  3:33 PM  Resp 28 02/21/2018  3:33 PM  SpO2 100 % 02/21/2018  3:33 PM  Vitals shown include unvalidated device data.  Last Pain:  Vitals:   02/21/18 1533  TempSrc: (P) Oral  PainSc:       Patients Stated Pain Goal: 7 (44/96/75 9163)  Complications: No apparent anesthesia complications

## 2018-02-21 NOTE — Progress Notes (Addendum)
LABOR PROGRESS NOTE  Casey Sullivan is a 32 y.o. [id number] at [redacted]w[redacted]d  admitted for IOL for postdates.  Subjective: Patient now more comfortable with epidural. Complaining of "butt" pain.   Objective: BP 138/74   Pulse (!) 116   Temp (!) 100.4 F (38 C) (Oral)   Resp 20   Ht 5\' 6"  (1.676 m)   Wt 98.6 kg (217 lb 6.4 oz)   LMP 05/09/2017 (Exact Date)   SpO2 100%   BMI 35.09 kg/m  or  Vitals:   02/21/18 0746 02/21/18 0803 02/21/18 0830 02/21/18 0900  BP: (!) 141/83 (!) 127/104 125/66 138/74  Pulse: (!) 112 100 (!) 106 (!) 116  Resp:    20  Temp:    (!) 100.4 F (38 C)  TempSrc:    Oral  SpO2: 100%     Weight:      Height:        SVE: Dilation: 6 Effacement (%): 70 Cervical Position: Middle Station: 0 Presentation: Vertex Exam by:: dr Gerarda Fraction FHT: baseline rate 160, moderate variability , +acel, variable decels  Toco: regular, q2-3 min   Assessment / Plan: 32 y.o. [name] at [redacted]w[redacted]d here for IOL for postdates  Labor: Continue to titrate Pitocin. IUPC replaced because not working. Continue amnioinfusion. Contractions have not been adequate. Triple I: Patient with temperature and fetus with tachycardia. Will start Amp/Gent.  [place]: new-onset intrapartum. Blood work pending. No severe range pressures. Asymptomatic  Fetal Wellbeing:  Cat II GBS status: negative Pain Control:  Well-controlled with epidural Anticipated MOD:  SVD  Luiz Blare, DO 02/21/2018, 9:23 AM

## 2018-02-21 NOTE — Progress Notes (Signed)
LABOR PROGRESS NOTE  Casey Sullivan is a 32 y.o. G4P1021 at [redacted]w[redacted]d  admitted for IOL for postdates  Subjective: Patient now more comfortable with epidural.   Objective: BP 140/78   Pulse 90   Temp 99.2 F (37.3 C) (Axillary)   Resp 18   Ht 5\' 6"  (1.676 m)   Wt 217 lb 6.4 oz (98.6 kg)   LMP 05/09/2017 (Exact Date)   SpO2 98%   BMI 35.09 kg/m  or  Vitals:   02/21/18 0430 02/21/18 0448 02/21/18 0500 02/21/18 0530  BP: 136/79  130/81 140/78  Pulse: (!) 103  96 90  Resp:   18 18  Temp:  99.2 F (37.3 C)    TempSrc:  Axillary    SpO2: 99%  99% 98%  Weight:      Height:        SVE: 60cm/80%/-2 FHT: baseline rate 150, moderate variability , +acel, variable decels  Toco: MVUs ~180   Assessment / Plan: 32 y.o. [id number] at [redacted]w[redacted]d here for IOL for postdates  Labor: Continue to titrate Pitocni Fetal Wellbeing:  Cat II with mild variable decels; + acel. Continue amnioinfusion GBS status: negative Pain Control:  Well-controlled with epidural Anticipated MOD:  SVD  Gailen Shelter, MD 02/21/2018, 5:52 AM

## 2018-02-21 NOTE — Progress Notes (Signed)
LABOR PROGRESS NOTE  [name] is a 32 y.o. G4P1021 at [redacted]w[redacted]d  admitted for IOL for postdates  Subjective: Patient now more comfortable with epidural.   Objective: BP (!) 144/86   Pulse (!) 112   Temp 98 F (36.7 C) (Oral)   Resp 18   Ht 5\' 6"  (1.676 m)   Wt 217 lb 6.4 oz (98.6 kg)   LMP 05/09/2017 (Exact Date)   SpO2 100%   BMI 35.09 kg/m  or  Vitals:   02/21/18 0201 02/21/18 0230 02/21/18 0301 02/21/18 0304  BP: 139/81 (!) 143/82 (!) 144/86   Pulse: (!) 102 94 (!) 112   Resp: 18   18  Temp:    98 F (36.7 C)  TempSrc:    Oral  SpO2:      Weight:      Height:        SVE: 5.5cm/80%/-2 FHT: baseline rate 150, moderate variability , +acel, variable decels  Toco: MVUs ~150   Assessment / Plan: 32 y.o. [id number] at [redacted]w[redacted]d here for IOL for postdates  Labor: Restart Pitocin (previously discontinued d/t NRFHT) Fetal Wellbeing:  Cat II with mild variable decels; + acel. Continue amnioinfusion GBS status: negative Pain Control:  Well-controlled with epidural Anticipated MOD:  SVD  Gailen Shelter, MD 02/21/2018, 3:31 AM

## 2018-02-21 NOTE — Op Note (Signed)
Casey Sullivan PROCEDURE DATE: 02/21/2018  PREOPERATIVE DIAGNOSIS: Intrauterine pregnancy at  [redacted]w[redacted]d weeks gestation; failed induction due to fetal intolerance to labor  POSTOPERATIVE DIAGNOSIS: The same  PROCEDURE: Primary Low Transverse Cesarean Section  SURGEON:  Dr. Loma Boston and Dr. Luiz Blare  ASSISTANT: None  INDICATIONS: Casey Sullivan is a 32 y.o. P9J0932 at [redacted]w[redacted]d scheduled for cesarean section secondary to failed induction due to fetal intolerance to labor.  The risks of cesarean section discussed with the patient included but were not limited to: bleeding which may require transfusion or reoperation; infection which may require antibiotics; injury to bowel, bladder, ureters or other surrounding organs; injury to the fetus; need for additional procedures including hysterectomy in the event of a life-threatening hemorrhage; placental abnormalities wth subsequent pregnancies, incisional problems, thromboembolic phenomenon and other postoperative/anesthesia complications. The patient concurred with the proposed plan, giving informed written consent for the procedure.    FINDINGS:  Viable girl infant in LOP presentation.  Apgars 7 and 8, weight, 7 pounds and 11.5 ounces.  Moderate meconium amniotic fluid.  Intact placenta, three vessel cord.  Normal uterus, fallopian tubes and ovaries bilaterally.  ANESTHESIA: Epidural INTRAVENOUS FLUIDS: 2000 ml ESTIMATED BLOOD LOSS: 642 ml URINE OUTPUT:  200 ml SPECIMENS: Placenta sent to pathology COMPLICATIONS: None immediate  PROCEDURE IN DETAIL:  The patient received intravenous antibiotics and had sequential compression devices applied to her lower extremities while in the preoperative area.  She was then taken to the operating room where epidural anesthesia was dosed up to surgical level and was found to be adequate. She was then placed in a dorsal supine position with a leftward tilt, and prepped and draped in a sterile manner.  A foley  catheter was placed into her bladder and attached to constant gravity, which drained clear fluid throughout.  After an adequate timeout was performed, a Pfannenstiel skin incision was made with scalpel and carried through to the underlying layer of fascia. The fascia was incised in the midline and this incision was extended bilaterally using the Mayo scissors. Kocher clamps were applied to the superior aspect of the fascial incision and the underlying rectus muscles were dissected off bluntly. A similar process was carried out on the inferior aspect of the facial incision. The rectus muscles were separated in the midline bluntly and the peritoneum was entered bluntly. An Alexis retractor was placed to aid in visualization of the uterus.  Attention was turned to the lower uterine segment where a transverse hysterotomy was made with a scalpel and extended bilaterally bluntly. The infant was successfully delivered, and cord was clamped and cut and infant was handed over to awaiting neonatology team. Uterine massage was then administered and the placenta delivered intact with three-vessel cord. The uterus was then cleared of clot and debris.  The hysterotomy was closed with 0 Vicryl in a running locked fashion, and an imbricating layer was also placed with a 0 Vicryl. Overall, excellent hemostasis was noted after one figure of eight suture placed. The abdomen and the pelvis were cleared of all clot and debris then irrigated. The Ubaldo Glassing was removed. Hemostasis was confirmed on all surfaces.  The peritoneum was reapproximated using 2-0 vicryl running stitches. The fascia was then closed using 0 Vicryl in a running fashion. The skin was closed with 4-0 vicryl. The patient tolerated the procedure well. Sponge, lap, instrument and needle counts were correct x 2. She was taken to the recovery room in stable condition.    [name], DO 02/21/2018  3:26 PM

## 2018-02-21 NOTE — Progress Notes (Signed)
Patient ID: Casey Sullivan, female   DOB: 1985/12/31, 32 y.o.   MRN: [id number]  Patient seen and examined for recurrent lates with occasional prolonged decel. Pitocin off, s/p terbutaline. Baby OP by [place] and exam.  Cervical exam - minimal cervix on maternal left, about 2cm of cervix on pt's right. 90% effaced.   Discussed that there has been minimal change and that not able to get in good contraction pattern. Discussed options of cesarean delivery, which is my recommendation at this point. Patient would like to wait another hour to see if remainder of cervix able to dilate. Will place patient in peanut and monitor closely. Will recheck in 1 hr.  Truett Mainland, DO

## 2018-02-21 NOTE — Progress Notes (Signed)
Patient ID: Casey Sullivan, female   DOB: December 25, 1985, 32 y.o.   MRN: [id number]  Continues to have late decelerations. No cervical change. Pt amenable to cesarean delivery.  The risks of cesarean section discussed with the patient included but were not limited to: bleeding which may require transfusion or reoperation; infection which may require antibiotics; injury to bowel, bladder, ureters or other surrounding organs; injury to the fetus; need for additional procedures including hysterectomy in the event of a life-threatening hemorrhage; placental abnormalities wth subsequent pregnancies, incisional problems, thromboembolic phenomenon and other postoperative/anesthesia complications. The patient concurred with the proposed plan, giving informed written consent for the procedure.   Patient has been NPO since last night, she will remain NPO for procedure. Anesthesia and OR aware.  Preoperative prophylactic Ancef and azithromycin ordered on call to the OR.  To OR when ready.  Truett Mainland, DO 02/21/2018 2:03 PM

## 2018-02-22 ENCOUNTER — Encounter (HOSPITAL_COMMUNITY): Payer: Self-pay

## 2018-02-22 LAB — COMPREHENSIVE METABOLIC PANEL
ALT: 10 U/L — AB (ref 14–54)
AST: 17 U/L (ref 15–41)
Albumin: 1.8 g/dL — ABNORMAL LOW (ref 3.5–5.0)
Alkaline Phosphatase: 119 U/L (ref 38–126)
Anion gap: 8 (ref 5–15)
BUN: 14 mg/dL (ref 6–20)
CHLORIDE: 108 mmol/L (ref 101–111)
CO2: 22 mmol/L (ref 22–32)
CREATININE: 1.16 mg/dL — AB (ref 0.44–1.00)
Calcium: 8.1 mg/dL — ABNORMAL LOW (ref 8.9–10.3)
GFR calc non Af Amer: >60 mL/min (ref >60)
Glucose, Bld: 83 mg/dL (ref 65–99)
Potassium: 3.9 mmol/L (ref 3.5–5.1)
SODIUM: 138 mmol/L (ref 135–145)
Total Bilirubin: 0.4 mg/dL (ref 0.3–1.2)
Total Protein: 5.1 g/dL — ABNORMAL LOW (ref 6.5–8.1)

## 2018-02-22 LAB — CBC
HCT: 26.2 % — ABNORMAL LOW (ref 36.0–46.0)
Hemoglobin: 9.2 g/dL — ABNORMAL LOW (ref 12.0–15.0)
MCH: 26.9 pg (ref 26.0–34.0)
MCHC: 35.1 g/dL (ref 30.0–36.0)
MCV: 76.6 fL — ABNORMAL LOW (ref 78.0–100.0)
PLATELETS: 236 10*3/uL (ref 150–400)
RBC: 3.42 MIL/uL — AB (ref 3.87–5.11)
RDW: 15.3 % (ref 11.5–15.5)
WBC: 19 10*3/uL — ABNORMAL HIGH (ref 4.0–10.5)

## 2018-02-22 MED ORDER — NALOXONE HCL 0.4 MG/ML IJ SOLN: 0.4000 mg | INTRAMUSCULAR | Status: DC | PRN

## 2018-02-22 MED ORDER — SODIUM CHLORIDE 0.9% FLUSH: 3.0000 mL | INTRAVENOUS | Status: DC | PRN

## 2018-02-22 NOTE — Anesthesia Postprocedure Evaluation (Signed)
Anesthesia Post Note  Patient: Casey Sullivan  Procedure(s) Performed: CESAREAN SECTION (N/A )     Patient location during evaluation: Mother Baby Anesthesia Type: Epidural Level of consciousness: awake and alert and oriented Pain management: satisfactory to patient Vital Signs Assessment: post-procedure vital signs reviewed and stable Respiratory status: respiratory function stable Cardiovascular status: stable Postop Assessment: no headache, no backache, epidural receding, patient able to bend at knees, no signs of nausea or vomiting and adequate PO intake Anesthetic complications: no    Last Vitals:  Vitals:   02/22/18 0440 02/22/18 0636  BP:  111/66  Pulse:  86  Resp:  18  Temp:  36.4 C  SpO2: 95% 95%    Last Pain:  Vitals:   02/22/18 0732  TempSrc:   PainSc: 8    Pain Goal: Patients Stated Pain Goal: 7 (02/20/18 1100)               Mashawn Brazil

## 2018-02-22 NOTE — Lactation Note (Signed)
This note was copied from a baby's chart. Lactation Consultation Note  Patient Name: Casey Sullivan [id number] Date: 02/22/2018 Reason for consult: Initial assessment   P2, Mother had difficulty latching first child and did not breastfeed. Mother feels better because this child is eager and latching well per mom. Baby is latched upon entering with intermittent sucks and swallows. Reviewed hand expression and basics. Mom encouraged to feed baby 8-12 times/24 hours and with feeding cues.  Mom made aware of O/P services, breastfeeding support groups, community resources, and our phone # for post-discharge questions.     Maternal Data Has patient been taught Hand Expression?: Yes Does the patient have breastfeeding experience prior to this delivery?: No  Feeding Feeding Type: Breast Fed Length of feed: 30 min  LATCH Score Latch: Grasps breast easily, tongue down, lips flanged, rhythmical sucking.(per mother)  Audible Swallowing: A few with stimulation  Type of Nipple: Everted at rest and after stimulation  Comfort (Breast/Nipple): Soft / non-tender  Hold (Positioning): No assistance needed to correctly position infant at breast.  LATCH Score: 9  Interventions Interventions: Breast feeding basics reviewed;Assisted with latch;Hand express  Lactation Tools Discussed/Used     Consult Status Consult Status: Follow-up Date: 02/23/18 Follow-up type: In-patient    Casey Sullivan [facility] 02/22/2018, 10:35 AM

## 2018-02-22 NOTE — Plan of Care (Signed)
  Problem: Activity: Goal: Will verbalize the importance of balancing activity with adequate rest periods Note:  Discussed with patient the importance of increasing ambulation today and in hallway three times a day. Discussed prn medications with patient as well and how often she could receive them. Lucia Bitter Kenel    Problem: Skin Integrity: Goal: Demonstration of wound healing without infection will improve Note:  Instructed patient to remove pressure dressing in shower this afternoon and leave honeycomb in place. Encouraged patient to call for assistance if needed. Maxwell Caul, Leretha Dykes [place]

## 2018-02-22 NOTE — Progress Notes (Signed)
Subjective: Postpartum Day 1: Cesarean Delivery Patient reports tolerating PO.  Pain controlled. No concerns.   Objective: Vital signs in last 24 hours: Temp:  [97.6 F (36.4 C)-100.4 F (38 C)] 97.6 F (36.4 C) (04/10 0636) Pulse Rate:  [86-131] 86 (04/10 0636) Resp:  [16-32] 18 (04/10 0636) BP: (107-147)/(37-107) 111/66 (04/10 0636) SpO2:  [95 %-100 %] 95 % (04/10 0636)  Physical Exam:  General: alert, cooperative and no distress Lochia: appropriate Uterine Fundus: firm Incision: no significant drainage DVT Evaluation: No evidence of DVT seen on physical exam.  Recent Labs    02/21/18 0808 02/22/18 0548  HGB 11.9* 9.2*  HCT 34.9* 26.2*    Assessment/Plan: Status post Cesarean section. Doing well postoperatively.  Continue current care. AKI - avoid nephrotoxic medications. Recheck SCr in AM. Up and out of bed  Luiz Blare, DO 02/22/2018, 8:35 AM

## 2018-02-22 NOTE — Anesthesia Postprocedure Evaluation (Signed)
Anesthesia Post Note  Patient: Casey Sullivan  Procedure(s) Performed: CESAREAN SECTION (N/A )     Patient location during evaluation: PACU Anesthesia Type: Epidural Level of consciousness: awake Pain management: satisfactory to patient Vital Signs Assessment: post-procedure vital signs reviewed and stable Respiratory status: spontaneous breathing Cardiovascular status: blood pressure returned to baseline Postop Assessment: no headache and epidural receding Anesthetic complications: no    Last Vitals:  Vitals:   02/22/18 0440 02/22/18 0636  BP:  111/66  Pulse:  86  Resp:  18  Temp:  36.4 C  SpO2: 95% 95%    Last Pain:  Vitals:   02/22/18 0732  TempSrc:   PainSc: 8                  Keslie Gritz ED[place]

## 2018-02-22 NOTE — Progress Notes (Signed)
CSW acknowledges consult and completed chart review.  MOB had more than 3 PNC visits and does not warrants for a CSW consult.   Please contact the Clinical Social Worker if needs arise, by [facility] request, or if MOB scores greater than 9/yes to question 10 on Edinburgh Postpartum Depression Screen.  Laurey Arrow, MSW, LCSW Clinical Social Work [phone]

## 2018-02-22 NOTE — Addendum Note (Signed)
Addendum  created 02/22/18 0936 by Flossie Dibble, CRNA   Sign clinical note

## 2018-02-23 ENCOUNTER — Encounter (HOSPITAL_COMMUNITY): Payer: Self-pay | Admitting: Family Medicine

## 2018-02-23 DIAGNOSIS — Z98891 History of uterine scar from previous surgery: Secondary | ICD-10-CM

## 2018-02-23 LAB — CREATININE, SERUM
Creatinine, Ser: 0.97 mg/dL (ref 0.44–1.00)
GFR calc Af Amer: >60 mL/min (ref >60)

## 2018-02-23 NOTE — Progress Notes (Signed)
Subjective: Postpartum Day 2: Cesarean Delivery Patient reports incisional pain, tolerating PO and no problems voiding.    Objective: Vital signs in last 24 hours: Temp:  [97.5 F (36.4 C)-98.7 F (37.1 C)] 98.7 F (37.1 C) (04/11 0530) Pulse Rate:  [83-105] 96 (04/11 0530) Resp:  [16-18] 18 (04/11 0530) BP: (111-139)/(58-82) 139/82 (04/11 0530) SpO2:  [95 %-100 %] 100 % (04/10 1500)  Physical Exam:  General: alert, cooperative and no distress Lochia: appropriate Uterine Fundus: firm Incision: healing well, no significant drainage DVT Evaluation: No evidence of DVT seen on physical exam.  Recent Labs    02/21/18 0808 02/22/18 0548  HGB 11.9* 9.2*  HCT 34.9* 26.2*    Assessment/Plan: Status post Cesarean section. Doing well postoperatively. Blood just drawn for recheck of Creatinine  Continue care Await Serum Cr result Will plan discharge for tomorrow.  Casey Sullivan 02/23/2018, 6:33 AM

## 2018-02-23 NOTE — Discharge Instructions (Signed)

## 2018-02-23 NOTE — Lactation Note (Signed)
This note was copied from a baby's chart. Lactation Consultation Note  Patient Name: Casey Sullivan [id number] Date: 02/23/2018  Mom states baby is latching well.  She is giving occasional formula supplementation by choice.  Discussed importance of breastfeeding before formula each feeding.  Discussed milk coming to volume.  Encouraged to call for assist prn.   Maternal Data    Feeding Feeding Type: Bottle Fed - Formula Nipple Type: Slow - flow  LATCH Score                   Interventions    Lactation Tools Discussed/Used     Consult Status      Ave Filter 02/23/2018, 2:11 PM

## 2018-02-23 NOTE — Plan of Care (Signed)
  Problem: Pain Managment: Goal: General experience of comfort will improve 02/23/2018 1032 by Tollie Eth, RN Note:  Discussed with patient the importance of ambulating in hallway today at least three times. Patient is passing gas now; however, has discomfort from gas as well. Discussed increasing fluid intake along with warm fluids and ambulation. Encouraged patient to call for pain medications when they are due. Maxwell Caul, Leretha Dykes [place]

## 2018-02-24 DIAGNOSIS — O139 Gestational [pregnancy-induced] hypertension without significant proteinuria, unspecified trimester: Secondary | ICD-10-CM

## 2018-02-24 MED ORDER — IBUPROFEN 200 MG PO TABS: 600.0000 mg | ORAL_TABLET | Freq: Three times a day (TID) | ORAL | 0 refills | Status: AC | PRN

## 2018-02-24 MED ORDER — OXYCODONE HCL 5 MG PO TABS: 5.0000 mg | ORAL_TABLET | ORAL | 0 refills | Status: DC | PRN

## 2018-02-24 MED ORDER — ACETAMINOPHEN 325 MG PO TABS: 650.0000 mg | ORAL_TABLET | ORAL | 0 refills | Status: AC | PRN

## 2018-02-24 MED ORDER — TETANUS-DIPHTH-ACELL PERTUSSIS 5-2.5-18.5 LF-MCG/0.5 IM SUSP: 0.5000 mL | Freq: Once | INTRAMUSCULAR | 0 refills | Status: AC

## 2018-02-24 MED ORDER — SIMETHICONE 80 MG PO CHEW: 80.0000 mg | CHEWABLE_TABLET | Freq: Four times a day (QID) | ORAL | 2 refills | Status: AC | PRN

## 2018-02-24 NOTE — Lactation Note (Signed)
This note was copied from a baby's chart. Lactation Consultation Note  Patient Name: Casey Sullivan [id number] Date: 02/24/2018 Reason for consult: Follow-up assessment   Baby 96 hours old, [redacted]w[redacted]d and mother requesting rental pump. Referred mother to [employer] in lobby for rental or suggest she make appt w/ WIC. Explained we no longer rent pumps. Mother states baby is having a shallow latch and suggest she call if she would like assistance. Encouraged mother to undress baby for feedings. Provided mother w/ manual pump. Mom encouraged to feed baby 8-12 times/24 hours and with feeding cues.  Reviewed engorgement care and monitoring voids/stools.      Maternal Data    Feeding    LATCH Score                   Interventions    Lactation Tools Discussed/Used     Consult Status Consult Status: Complete    Carlye Grippe 02/24/2018, 11:39 AM

## 2018-02-24 NOTE — Discharge Summary (Addendum)
OB Discharge Summary     Patient Name: Casey Sullivan DOB: November 05, 1986 MRN: [id number]  Date of admission: 02/20/2018 Delivering MD: Truett Mainland   Date of discharge: 02/24/2018  Admitting diagnosis: INDUCTION Intrauterine pregnancy: [redacted]w[redacted]d     Secondary diagnosis:  Principal Problem:   Status post primary low transverse cesarean section Active Problems:   Gestational hypertension, antepartum  Additional problems: none     Discharge diagnosis: Post-dates pregnancy, delivered via C-Section                                                                                                Post partum procedures:none  Augmentation: Pitocin and Cytotec  Complications: recurrent late decels necessitating stat C-Section  Hospital course:  Presentation for IOL 2/2 PD on 4/8. Initially augmented with cytotec and pitocin. Developed fevers and fetal tachycardia, amp and gent started on 4/9. Developed antepartum gestational hypertension. Developed recurrent late decels on 4/9. Underwent stat C-Section. Hemoglobin drop from 11.9 to 9.2 on POD#1. On POD#2 patient able to ambulate, pain well controlled. Passing some flatus. On POD#3 (4/12) patient was discharged home. To follow up in 2 weeks for a wound check and 6 weeks for post-partum check. Will be seen in 1 week for babylove nurse visit for blood pressure check.  Physical exam  Vitals:   02/22/18 1935 02/23/18 0530 02/23/18 1831 02/24/18 0520  BP: 136/76 139/82 136/81 128/79  Pulse: 89 96 79 83  Resp: 18 18 17 18   Temp: 98.5 F (36.9 C) 98.7 F (37.1 C) 98.3 F (36.8 C) 98.3 F (36.8 C)  TempSrc: Oral Oral Oral Oral  SpO2:   99% 99%  Weight:      Height:       General: alert and no distress Lochia: appropriate Uterine Fundus: firm Incision: Healing well with no significant drainage, Dressing is clean, dry, and intact DVT Evaluation: No evidence of DVT seen on physical exam. Negative Homan's sign. Labs: Lab Results   Component Value Date   WBC 19.0 (H) 02/22/2018   HGB 9.2 (L) 02/22/2018   HCT 26.2 (L) 02/22/2018   MCV 76.6 (L) 02/22/2018   PLT 236 02/22/2018   CMP Latest Ref Rng & Units 02/23/2018  Glucose 65 - 99 mg/dL -  BUN 6 - 20 mg/dL -  Creatinine 0.44 - 1.00 mg/dL 0.97  Sodium 135 - 145 mmol/L -  Potassium 3.5 - 5.1 mmol/L -  Chloride 101 - 111 mmol/L -  CO2 22 - 32 mmol/L -  Calcium 8.9 - 10.3 mg/dL -  Total Protein 6.5 - 8.1 g/dL -  Total Bilirubin 0.3 - 1.2 mg/dL -  Alkaline Phos 38 - 126 U/L -  AST 15 - 41 U/L -  ALT 14 - 54 U/L -    Discharge instruction: per After Visit Summary and "Baby and Me Booklet".  After visit meds:  Allergies as of 02/24/2018   No Known Allergies     Medication List    TAKE these medications   acetaminophen 325 MG tablet Commonly known as:  TYLENOL Take 2 tablets (650 mg total) by  mouth every 4 (four) hours as needed (for pain scale < 4).   COMFORT FIT MATERNITY SUPP SM Misc 1 Device by Does not apply route daily.   ibuprofen 200 MG tablet Commonly known as:  MOTRIN IB Take 3 tablets (600 mg total) by mouth every 8 (eight) hours as needed.   oxyCODONE 5 MG immediate release tablet Commonly known as:  Oxy IR/ROXICODONE Take 1 tablet (5 mg total) by mouth every 4 (four) hours as needed (pain scale 4-7).   PRENATAL VITAMINS PLUS 27-1 MG Tabs Take 1 tablet by mouth 1 day or 1 dose. What changed:  when to take this   simethicone 80 MG chewable tablet Commonly known as:  GAS-X Chew 1 tablet (80 mg total) by mouth 4 (four) times daily as needed for flatulence.   Tdap 5-2.5-18.5 LF-MCG/0.5 injection Commonly known as:  BOOSTRIX Inject 0.5 mLs into the muscle once for 1 dose.            Discharge Care Instructions  (From admission, onward)        Start     Ordered   02/24/18 0000  Discharge wound care:    Comments:  Ok to shower with honeycomb dressing. If the dressing falls off it is ok. When the edges start to peel off, take a  warm shower and take off dressing. If there are steri-strips underneath the dressing, please also take these off with the dressing.   02/24/18 0902      Diet: routine diet  Activity: Advance as tolerated. Pelvic rest for 6 weeks.   Outpatient follow up: 2 weeks for wound check and 6 weeks for PP check Follow up Appt:No future appointments. Follow up Visit:No follow-ups on file.  Postpartum contraception: Progesterone only pills  Newborn Data: Live born female  Birth Weight: 7 lb 11.5 oz (3500 g) APGAR: 7, 8  Newborn Delivery   Birth date/time:  02/21/2018 14:44:00 Delivery type:  C-Section, Low Transverse Trial of labor:  No C-section categorization:  Primary     Baby Feeding: Bottle and Breast Disposition:home with mother   02/24/2018 Guadalupe Dawn, MD  OB FELLOW DISCHARGE ATTESTATION  I have seen and examined this patient and agree with above documentation in the resident's note.   Gailen Shelter, MD OB Fellow 12:24 PM

## 2018-02-24 NOTE — Lactation Note (Signed)
This note was copied from a baby's chart. Lactation Consultation Note Baby 85 hrs old having low out put. Only 4 voids and 3 stools in life. No stool > 48 hrs.  Mom has "V" shaped breast w/short shaft nipples, compressible, wide space. LC unable to obtain colostrum at this time. Mom using DEBP w/no colostrum noted. Encouraged to continue to use for stimulation.  Mom has 32 yr old that she BF for 1 month d/t low milk supply, had to give formula. Mom stated no increase in breast size during pregnancy. Breast has become tender since delivery.  Mom supplementing w/[place] 19 cal. Supplement feeding sheet reviewed. Encouraged mom to give at least 30 ml of formula after BF or every 3 hrs. Discussed if baby wants to BF every hours that's good but only supplement every 3 hours.  Mom done teach back w/understanding.  Reported to RN of consult and feeding plan.  Patient Name: Casey Sullivan [id number] Date: 02/24/2018 Reason for consult: Follow-up assessment;Other (Comment)(low output/no stool in >48 hrs)   Maternal Data    Feeding Feeding Type: Breast Fed  LATCH Score Latch: Repeated attempts needed to sustain latch, nipple held in mouth throughout feeding, stimulation needed to elicit sucking reflex.  Audible Swallowing: None  Type of Nipple: Everted at rest and after stimulation  Comfort (Breast/Nipple): Soft / non-tender  Hold (Positioning): Assistance needed to correctly position infant at breast and maintain latch.  LATCH Score: 5  Interventions Interventions: Breast feeding basics reviewed;Position options;Skin to skin;Breast massage;Breast compression;DEBP  Lactation Tools Discussed/Used Tools: Pump Breast pump type: Double-Electric Breast Pump Pump Review: Setup, frequency, and cleaning;Milk Storage Initiated by:: Kalman Jewels, RN  Date initiated:: 02/24/18   Consult Status Consult Status: Follow-up Date: 02/25/18 Follow-up type: In-patient    Theodoro Kalata 02/24/2018, 2:44 AM

## 2018-02-28 ENCOUNTER — Telehealth: Payer: Self-pay | Admitting: General Practice

## 2018-02-28 NOTE — Telephone Encounter (Signed)
Patient notified of appointments scheduled for 03/02/18 at 1:30pm (RN visit) and 04/04/18 at 9:15am (PP visit).

## 2018-03-02 ENCOUNTER — Ambulatory Visit (INDEPENDENT_AMBULATORY_CARE_PROVIDER_SITE_OTHER): Payer: Self-pay | Admitting: General Practice

## 2018-03-02 VITALS — BP 130/81 | HR 78 | Ht 66.0 in | Wt 202.0 lb

## 2018-03-02 DIAGNOSIS — Z5189 Encounter for other specified aftercare: Secondary | ICD-10-CM

## 2018-03-02 DIAGNOSIS — Z0130 Encounter for examination of blood pressure without abnormal findings: Secondary | ICD-10-CM

## 2018-03-02 NOTE — Progress Notes (Signed)
I have reviewed the chart and agree with nursing staff's documentation of this patient's encounter.  Mora Bellman, MD 03/02/2018 4:21 PM

## 2018-03-02 NOTE — Progress Notes (Signed)
Patient here for BP check & wound check today. Patient denies headaches, dizziness, or blurry vision. +1 edema noted in feet/ankles. Patient is not on BP medication. Delivered via c-section on 02/21/18. Patient's incision is clean, dry & intact. Reviewed wound care and signs & symptoms of infection. Pp visit scheduled for 5/21.

## 2018-03-10 ENCOUNTER — Ambulatory Visit (INDEPENDENT_AMBULATORY_CARE_PROVIDER_SITE_OTHER): Payer: Medicaid Other | Admitting: General Practice

## 2018-03-10 VITALS — BP 133/87 | HR 74 | Ht 66.0 in | Wt 196.0 lb

## 2018-03-10 DIAGNOSIS — T148XXA Other injury of unspecified body region, initial encounter: Secondary | ICD-10-CM

## 2018-03-10 DIAGNOSIS — Z5189 Encounter for other specified aftercare: Secondary | ICD-10-CM

## 2018-03-10 DIAGNOSIS — L089 Local infection of the skin and subcutaneous tissue, unspecified: Secondary | ICD-10-CM

## 2018-03-10 MED ORDER — CEPHALEXIN 500 MG PO CAPS: 500.0000 mg | ORAL_CAPSULE | Freq: Three times a day (TID) | ORAL | 0 refills | Status: DC

## 2018-03-10 NOTE — Progress Notes (Signed)
Patient here today for wound check. States 3 days ago she noticed the incision was open a little, draining a small amount of brown or yellow liquid and had a bad smell. Patient had c-section on 02/21/18.  Incision is superficially open 1.5-2cm on left side with scant yellow drainage. Marcille Buffy in to assess wound, who recommends 7 day course of Keflex as prophylaxis for wound infection. Wound care and signs and symptoms of further infection reviewed with patient. Patient verbalized understanding & had no questions.

## 2018-03-10 NOTE — Progress Notes (Signed)
I have reviewed the nurse's note, and agree with the plan of care.  Marcille Buffy 4:53 PM 03/10/18

## 2018-04-04 ENCOUNTER — Encounter: Payer: Self-pay | Admitting: Obstetrics and Gynecology

## 2018-04-04 ENCOUNTER — Ambulatory Visit (INDEPENDENT_AMBULATORY_CARE_PROVIDER_SITE_OTHER): Payer: Self-pay | Admitting: Obstetrics and Gynecology

## 2018-04-04 MED ORDER — NORGESTIMATE-ETH ESTRADIOL 0.25-35 MG-MCG PO TABS: 1.0000 | ORAL_TABLET | Freq: Every day | ORAL | 11 refills | Status: DC

## 2018-04-04 NOTE — Patient Instructions (Signed)
Sutured Wound Care Sutures are stitches that can be used to close wounds. Taking care of your wound properly can help prevent pain and infection. It can also help your wound to heal more quickly. How is this treated? Wound Care  Keep the wound clean and dry.  If you were given a bandage (dressing), change it at least one time per day or as told by your doctor. You should also change it if it gets wet or dirty.  Keep the wound completely dry for the first 24 hours or as told by your doctor. After that time, you may shower or bathe. However, make sure that the wound is not soaked in water until the sutures have been removed.  Clean the wound one time each day or as told by your doctor. ? Wash the wound with soap and water. ? Rinse the wound with water to remove all soap. ? Pat the wound dry with a clean towel. Do not rub the wound.  After cleaning the wound, put a thin layer of antibiotic ointment on it as told your doctor. This ointment: ? Helps to prevent infection. ? Keeps the bandage from sticking to the wound.  Have the sutures removed as told by your doctor. General Instructions  Take or apply medicines only as told by your doctor.  To help prevent scarring, make sure to cover your wound with sunscreen whenever you are outside after the sutures are removed and the wound is healed. Make sure to wear a sunscreen of at least 30 SPF.  If you were prescribed an antibiotic medicine or ointment, finish all of it even if you start to feel better.  Do not scratch or pick at the wound.  Keep all follow-up visits as told by your doctor. This is important.  Check your wound every day for signs of infection. Watch for: ? Redness, swelling, or pain. ? Fluid, blood, or pus.  Raise (elevate) the injured area above the level of your heart while you are sitting or lying down, if possible.  Avoid stretching your wound.  Drink enough fluids to keep your pee (urine) clear or pale  yellow. Contact a doctor if:  You were given a tetanus shot and you have any of these where the needle went in: ? Swelling. ? Very bad pain. ? Redness. ? Bleeding.  You have a fever.  A wound that was closed breaks open.  You notice a bad smell coming from the wound.  You notice something coming out of the wound, such as wood or glass.  Medicine does not help your pain.  You have any of these at the site of the wound. ? More redness. ? More swelling. ? More pain.  You have any of these coming from the wound. ? Fluid. ? Blood. ? Pus.  You notice a change in the color of your skin near the wound.  You need to change the bandage often due to fluid, blood, or pus coming from the wound.  You have a new rash.  You have numbness around the wound. Get help right away if:  You have very bad swelling around the wound.  Your pain suddenly gets worse and is very bad.  You have painful lumps near the wound or on skin that is anywhere on your body.  You have a red streak going away from the wound.  The wound is on your hand or foot and you cannot move a finger or toe like normal.  The   wound is on your hand or foot and you notice that your fingers or toes look pale or bluish. This information is not intended to replace advice given to you by your health care provider. Make sure you discuss any questions you have with your health care provider. Document Released: 04/19/2008 Document Revised: 04/08/2016 Document Reviewed: 06/13/2013 Elsevier Interactive Patient Education  2017 Elsevier Inc.  

## 2018-04-04 NOTE — Progress Notes (Signed)
Post Partum Exam  Casey Sullivan is a 32 y.o. [phone] female who presents for a postpartum visit. She is 6 weeks postpartum following a primary low cervical transverse Cesarean section for fetal intolerance to labor. I have fully reviewed the prenatal and intrapartum course. The delivery was at 40 gestational weeks.  Anesthesia: epidural. Postpartum course has been unremarkable. Baby's course has been unremarkable. Baby is feeding by bottle - Marcos Eke. Bleeding no bleeding. Bowel function is normal. Bladder function is normal. Patient is not sexually active. Contraception method desired is OCP (estrogen/progesterone). Postpartum depression screening:neg  The following portions of the patient's history were reviewed and updated as appropriate: allergies, current medications, past family history, past medical history, past social history, past surgical history and problem list. Last pap smear done 08/2017 and was Normal  Review of Systems Pertinent items are noted in HPI.    Objective:  Blood pressure 121/81, pulse 88, resp. rate 16, height 5\' 6"  (1.676 m), weight 195 lb 4.8 oz (88.6 kg), not currently breastfeeding.  General:  alert, cooperative and no distress   Breasts:  negative  Abdomen: soft, non-tender; bowel sounds normal; no masses,  no organomegaly. Incision: two 1cm areas of open wound without drainage. Red granulation tissue present. Non-tender, no surrounding erythema.    Psych:  normal mood and affect   Skin: Skin is warm and dry. No rash noted.   Cardiovascular: Normal heart rate noted  Respiratory: Normal respiratory effort, no problems with respiration noted     Assessment:   Normal postpartum exam. Pap smear not done at today's visit.   Plan:   1. Contraception: OCP (estrogen/progesterone) prescribed.  2. Incision: still healing. No signs of infection. S/p treatment with antibiotics. Needs RN wound check in 2 weeks.  3. Follow up:  as needed.   Luiz Blare,  DO OB Fellow [facility], [facility]

## 2018-04-18 ENCOUNTER — Ambulatory Visit (INDEPENDENT_AMBULATORY_CARE_PROVIDER_SITE_OTHER): Payer: Self-pay | Admitting: General Practice

## 2018-04-18 VITALS — BP 133/77 | HR 72 | Ht 66.0 in | Wt 197.0 lb

## 2018-04-18 DIAGNOSIS — Z5189 Encounter for other specified aftercare: Secondary | ICD-10-CM

## 2018-04-18 NOTE — Progress Notes (Signed)
I have reviewed the chart and agree with nursing staff's documentation of this patient's encounter.  Kerry Hough, PA-C 04/18/2018 10:10 AM

## 2018-04-18 NOTE — Progress Notes (Signed)
Patient presents to office today for follow up incision check. Incision is clean, dry & intact. No further follow up needed. Patient had no questions.

## 2018-06-09 ENCOUNTER — Other Ambulatory Visit: Payer: Self-pay

## 2018-06-09 ENCOUNTER — Encounter (HOSPITAL_BASED_OUTPATIENT_CLINIC_OR_DEPARTMENT_OTHER): Payer: Self-pay | Admitting: Emergency Medicine

## 2018-06-09 ENCOUNTER — Emergency Department (HOSPITAL_BASED_OUTPATIENT_CLINIC_OR_DEPARTMENT_OTHER)
Admission: EM | Admit: 2018-06-09 | Discharge: 2018-06-09 | Disposition: A | Discharge status: Home / Self Care | Payer: Self-pay | Attending: Emergency Medicine | Admitting: Emergency Medicine

## 2018-06-09 DIAGNOSIS — R42 Dizziness and giddiness: Secondary | ICD-10-CM | POA: Insufficient documentation

## 2018-06-09 DIAGNOSIS — R6883 Chills (without fever): Secondary | ICD-10-CM | POA: Insufficient documentation

## 2018-06-09 DIAGNOSIS — Z79899 Other long term (current) drug therapy: Secondary | ICD-10-CM | POA: Insufficient documentation

## 2018-06-09 DIAGNOSIS — R112 Nausea with vomiting, unspecified: Secondary | ICD-10-CM | POA: Insufficient documentation

## 2018-06-09 DIAGNOSIS — R1033 Periumbilical pain: Secondary | ICD-10-CM | POA: Insufficient documentation

## 2018-06-09 LAB — CBC
HEMATOCRIT: 38.5 % (ref 36.0–46.0)
HEMOGLOBIN: 13.2 g/dL (ref 12.0–15.0)
MCH: 25.3 pg — AB (ref 26.0–34.0)
MCHC: 34.3 g/dL (ref 30.0–36.0)
MCV: 73.8 fL — ABNORMAL LOW (ref 78.0–100.0)
PLATELETS: 275 10*3/uL (ref 150–400)
RBC: 5.22 MIL/uL — AB (ref 3.87–5.11)
RDW: 14.7 % (ref 11.5–15.5)
WBC: 15.4 10*3/uL — AB (ref 4.0–10.5)

## 2018-06-09 LAB — URINALYSIS, ROUTINE W REFLEX MICROSCOPIC
Bilirubin Urine: NEGATIVE
Glucose, UA: NEGATIVE mg/dL
Hgb urine dipstick: NEGATIVE
Ketones, ur: 40 mg/dL — AB
LEUKOCYTES UA: NEGATIVE
Nitrite: NEGATIVE
Protein, ur: NEGATIVE mg/dL
SPECIFIC GRAVITY, URINE: 1.025 (ref 1.005–1.030)
pH: 5.5 (ref 5.0–8.0)

## 2018-06-09 LAB — COMPREHENSIVE METABOLIC PANEL
ALT: 27 U/L (ref 0–44)
ANION GAP: 9 (ref 5–15)
AST: 24 U/L (ref 15–41)
Albumin: 4.2 g/dL (ref 3.5–5.0)
Alkaline Phosphatase: 43 U/L (ref 38–126)
BUN: 11 mg/dL (ref 6–20)
CO2: 25 mmol/L (ref 22–32)
Calcium: 9.6 mg/dL (ref 8.9–10.3)
Chloride: 106 mmol/L (ref 98–111)
Creatinine, Ser: 0.72 mg/dL (ref 0.44–1.00)
Glucose, Bld: 115 mg/dL — ABNORMAL HIGH (ref 70–99)
POTASSIUM: 4.2 mmol/L (ref 3.5–5.1)
SODIUM: 140 mmol/L (ref 135–145)
Total Bilirubin: 0.4 mg/dL (ref 0.3–1.2)
Total Protein: 8.5 g/dL — ABNORMAL HIGH (ref 6.5–8.1)

## 2018-06-09 LAB — RAPID URINE DRUG SCREEN, HOSP PERFORMED
AMPHETAMINES: NOT DETECTED
BENZODIAZEPINES: NOT DETECTED
Barbiturates: NOT DETECTED
Cocaine: NOT DETECTED
Opiates: NOT DETECTED
Tetrahydrocannabinol: NOT DETECTED

## 2018-06-09 LAB — PREGNANCY, URINE: Preg Test, Ur: NEGATIVE

## 2018-06-09 LAB — LIPASE, BLOOD: Lipase: 42 U/L (ref 11–51)

## 2018-06-09 MED ORDER — DICYCLOMINE HCL 20 MG PO TABS: 20.0000 mg | ORAL_TABLET | Freq: Two times a day (BID) | ORAL | 0 refills | Status: AC | PRN

## 2018-06-09 MED ORDER — SODIUM CHLORIDE 0.9 % IV BOLUS
1000.0000 mL | Freq: Once | INTRAVENOUS | Status: AC
  Administered 2018-06-09: 1000 mL via INTRAVENOUS

## 2018-06-09 MED ORDER — GI COCKTAIL ~~LOC~~
30.0000 mL | Freq: Once | ORAL | Status: AC
  Administered 2018-06-09: 30 mL via ORAL
  Filled 2018-06-09: qty 30

## 2018-06-09 MED ORDER — ONDANSETRON HCL 4 MG PO TABS: 4.0000 mg | ORAL_TABLET | Freq: Three times a day (TID) | ORAL | 0 refills | Status: AC | PRN

## 2018-06-09 MED ORDER — FAMOTIDINE 20 MG PO TABS
20.0000 mg | ORAL_TABLET | Freq: Once | ORAL | Status: AC
Start: 2018-06-09 — End: 2018-06-09
  Administered 2018-06-09: 20 mg via ORAL
  Filled 2018-06-09: qty 1

## 2018-06-09 MED ORDER — DICYCLOMINE HCL 10 MG PO CAPS
10.0000 mg | ORAL_CAPSULE | Freq: Once | ORAL | Status: AC
  Administered 2018-06-09: 10 mg via ORAL
  Filled 2018-06-09: qty 1

## 2018-06-09 MED ORDER — HYDROMORPHONE HCL 1 MG/ML IJ SOLN
0.5000 mg | Freq: Once | INTRAMUSCULAR | Status: AC
  Administered 2018-06-09: 0.5 mg via INTRAVENOUS
  Filled 2018-06-09: qty 1

## 2018-06-09 MED ORDER — FAMOTIDINE 20 MG PO TABS: 20.0000 mg | ORAL_TABLET | Freq: Every day | ORAL | 0 refills | Status: AC

## 2018-06-09 MED ORDER — ONDANSETRON HCL 4 MG/2ML IJ SOLN
4.0000 mg | INTRAMUSCULAR | Status: AC
  Administered 2018-06-09: 4 mg via INTRAVENOUS
  Filled 2018-06-09: qty 2

## 2018-06-09 NOTE — ED Notes (Signed)
Pt. Family asking for pain med for the Pt.   Explained to the family that the Dr. Aletha Halim have to see the Pt. And then meds to be given based on pt. Assessment.

## 2018-06-09 NOTE — ED Triage Notes (Signed)
Middle abd pain with vomiting x 3 hours.

## 2018-06-09 NOTE — ED Provider Notes (Signed)
[place] EMERGENCY DEPARTMENT Provider Note   CSN: [id number] Arrival date & time: 06/09/18  1536     History   Chief Complaint Chief Complaint  Patient presents with  . Abdominal Pain    HPI Casey Sullivan is a 32 y.o. female who presents for intractable vomiting and periumbilical pain. The patient states that every time she does not drink warm or hot water before eating she has moderated to severe pain around her umbilicus with associated nausea and sometimes vomiting. This has been going on since she was a child. She says that her father had the same issue. She drank only room temp water today before eating and then developed 92/42 periumbilical pain shortly after with severe nausea and intractable vomiting. She felt light headed and also had chills. She denies fever. Constipation. Recent foreign travel or ingestion of suspicious foods. The patient did not take anything for her pain/ nausea today. She denies urinary sxs, pelvic pain or vaginal sxs. She has never seen a GI doctor and does not take any medications on a regular basis.   HPI  Past Medical History:  Diagnosis Date  . Sickle cell trait [facility])     Patient Active Problem List   Diagnosis Date Noted  . Gestational hypertension, antepartum 02/24/2018  . Status post primary low transverse cesarean section 02/23/2018  . Post-dates pregnancy 02/20/2018  . Limited prenatal care, third trimester 01/02/2018  . Sickle cell trait ([place]) 08/23/2017  . Encounter for supervision of other normal pregnancy, unspecified trimester 08/16/2017    Past Surgical History:  Procedure Laterality Date  . CESAREAN SECTION N/A 02/21/2018   Procedure: CESAREAN SECTION;  Surgeon: Truett Mainland, DO;  Location: [facility];  Service: Obstetrics;  Laterality: N/A;  . NO PAST SURGERIES    . OVUM / OOCYTE RETRIEVAL     egg donation     OB History    Gravida  4   Para  2   Term  2   Preterm      AB  2   Living    2     SAB      TAB  2   Ectopic      Multiple  0   Live Births  2            Home Medications    Prior to Admission medications   Medication Sig Start Date End Date Taking? Authorizing Provider  acetaminophen (TYLENOL) 325 MG tablet Take 2 tablets (650 mg total) by mouth every 4 (four) hours as needed (for pain scale < 4). 02/24/18   Guadalupe Dawn, MD  dicyclomine (BENTYL) 20 MG tablet Take 1 tablet (20 mg total) by mouth 2 (two) times daily as needed (stomach pain). 06/09/18   Margarita Mail, PA-C  famotidine (PEPCID) 20 MG tablet Take 1 tablet (20 mg total) by mouth daily. 06/09/18   Margarita Mail, PA-C  ibuprofen (MOTRIN IB) 200 MG tablet Take 3 tablets (600 mg total) by mouth every 8 (eight) hours as needed. 02/24/18   Guadalupe Dawn, MD  norgestimate-ethinyl estradiol (ORTHO-CYCLEN,SPRINTEC,PREVIFEM) 0.25-35 MG-MCG tablet Take 1 tablet by mouth daily. 04/04/18   [name], DO  ondansetron (ZOFRAN) 4 MG tablet Take 1 tablet (4 mg total) by mouth every 8 (eight) hours as needed for nausea or vomiting. 06/09/18   Margarita Mail, PA-C  Prenatal Vit-Fe Fumarate-FA (PRENATAL VITAMINS PLUS) 27-1 MG TABS Take 1 tablet by mouth 1 day or 1 dose. Patient taking  differently: Take 1 tablet by mouth daily.  06/15/14   Poe, Deirdre C, CNM  simethicone (GAS-X) 80 MG chewable tablet Chew 1 tablet (80 mg total) by mouth 4 (four) times daily as needed for flatulence. 02/24/18 02/24/19  Guadalupe Dawn, MD    Family History Family History  Problem Relation Age of Onset  . Stroke Father     Social History Social History   Tobacco Use  . Smoking status: Never Smoker  . Smokeless tobacco: Never Used  Substance Use Topics  . Alcohol use: Yes    Comment: occasioanl, none with preg  . Drug use: No     Allergies   Patient has no known allergies.   Review of Systems Review of Systems   Physical Exam Updated Vital Signs BP 121/83 (BP Location: Left Arm)   Pulse (!) 59    Temp 98.6 F (37 C) (Oral)   Resp 16   Ht 5\' 6"  (1.676 m)   Wt 81.6 kg (180 lb)   LMP 05/25/2018   SpO2 100%   BMI 29.05 kg/m   Physical Exam  Constitutional: She is oriented to person, place, and time. She appears well-developed and well-nourished. No distress.  HENT:  Head: Normocephalic and atraumatic.  Eyes: Pupils are equal, round, and reactive to light. Conjunctivae and EOM are normal. No scleral icterus.  Neck: Normal range of motion.  Cardiovascular: Normal rate, regular rhythm and normal heart sounds. Exam reveals no gallop and no friction rub.  No murmur heard. Pulmonary/Chest: Effort normal and breath sounds normal. No respiratory distress.  Abdominal: Soft. Normal appearance and bowel sounds are normal. She exhibits no distension and no mass. There is no tenderness. There is no rebound and no guarding. No hernia.  Neurological: She is alert and oriented to person, place, and time.  Skin: Skin is warm and dry. She is not diaphoretic.  Psychiatric: Her behavior is normal.  Nursing note and vitals reviewed.    ED Treatments / Results  Labs (all labs ordered are listed, but only abnormal results are displayed) Labs Reviewed  COMPREHENSIVE METABOLIC PANEL - Abnormal; Notable for the following components:      Result Value   Glucose, Bld 115 (*)    Total Protein 8.5 (*)    All other components within normal limits  CBC - Abnormal; Notable for the following components:   WBC 15.4 (*)    RBC 5.22 (*)    MCV 73.8 (*)    MCH 25.3 (*)    All other components within normal limits  URINALYSIS, ROUTINE W REFLEX MICROSCOPIC - Abnormal; Notable for the following components:   Ketones, ur 40 (*)    All other components within normal limits  LIPASE, BLOOD  PREGNANCY, URINE  RAPID URINE DRUG SCREEN, HOSP PERFORMED    EKG None  Radiology No results found.  Procedures Procedures (including critical care time)  Medications Ordered in ED Medications  sodium chloride  0.9 % bolus 1,000 mL (0 mLs Intravenous Stopped 06/09/18 1820)  HYDROmorphone (DILAUDID) injection 0.5 mg (0.5 mg Intravenous Given 06/09/18 1716)  ondansetron (ZOFRAN) injection 4 mg (4 mg Intravenous Given 06/09/18 1716)  dicyclomine (BENTYL) capsule 10 mg (10 mg Oral Given 06/09/18 1753)  gi cocktail (Maalox,Lidocaine,Donnatal) (30 mLs Oral Given 06/09/18 1754)  famotidine (PEPCID) tablet 20 mg (20 mg Oral Given 06/09/18 1753)     Initial Impression / Assessment and Plan / ED Course  I have reviewed the triage vital signs and the nursing notes.  Pertinent labs & imaging results that were available during my care of the patient were reviewed by me and considered in my medical decision making (see chart for details).    32 year old female with periumbilical abdominal pain and bizarre history of needing to drink hot water prior to eating to avoid abdominal pain and vomiting/nausea. The causes for generalized abdominal pain include but are not limited to gastritis, gastroenteritis, IBS, pancreatitis, peritonitis, intestinal ischemia, constipation, UTI, intestinal obstruction, perforated viscus, eg, peptic ulcer, appendix, gallbladder, diverticulitis, physical or sexual abuse, abdominal abscess, ruptured ectopic pregnancy, ruptured spleen, AAA, diabetic ketoacidosis, hypercalcemia, uremia, parasitic or other infection, eg: tapeworms, flukes, Giardia, Crypto, Yersinia, adrenal insufficiency,lead poisoning, iron toxicity, polyarteritis nodosa, Henoch-Schnlein purpura, porphyria, eg, acute intermittent porphyria, familial Mediterranean fever.  The patient has not had any further vomiting after medication treatment.  Her glucose and white blood cell count is slightly elevated which I think is secondary to acute phase reaction.  She has had no continued vomiting and was treated symptomatically here.  I do not feel the patient needs further imaging at this time.  She is hemodynamically stable and afebrile and  currently pain-free.  We have discussed reasons to seek immediate care the emergency department and including any localizing pain or intractable vomiting.  The patient appears appropriate for discharge and may follow-up with [facility] enterology   Final Clinical Impressions(s) / ED Diagnoses   Final diagnoses:  Periumbilical abdominal pain  Non-intractable vomiting with nausea, unspecified vomiting type    ED Discharge Orders        Ordered    famotidine (PEPCID) 20 MG tablet  Daily     06/09/18 1932    dicyclomine (BENTYL) 20 MG tablet  2 times daily PRN     06/09/18 1932    ondansetron (ZOFRAN) 4 MG tablet  Every 8 hours PRN     06/09/18 1932       Margarita Mail, PA-C 06/10/18 0008    Blanchie Dessert, MD 06/11/18 1818

## 2018-06-09 NOTE — ED Notes (Signed)
ED Provider at bedside. 

## 2018-06-09 NOTE — Discharge Instructions (Signed)

## 2019-05-14 ENCOUNTER — Telehealth: Payer: Self-pay

## 2019-05-14 ENCOUNTER — Other Ambulatory Visit: Payer: Self-pay

## 2019-05-14 NOTE — Telephone Encounter (Signed)
Pt called Nursd line today at 9:41am regarding getting BC Pills refilled, Called pt to verify [facility] Pill but she gave me a totally different name than the kind showing on her list of medications. She started was Rx during her Post partum in May of 2019. Pt also stated she has been having bumps since taking pills. Advised will request a appointment for her to come in regarding Birth Control. Pt verbalized understanding.

## 2019-05-20 IMAGING — US US MFM OB DETAIL+14 WK
1 series · 14 of 28 positions shown · non-contrast
Comparison: none

[Series 1: us mfm ob detail+14 wk · 103 acquisitions, 14 frames shown]
[im 4/103]
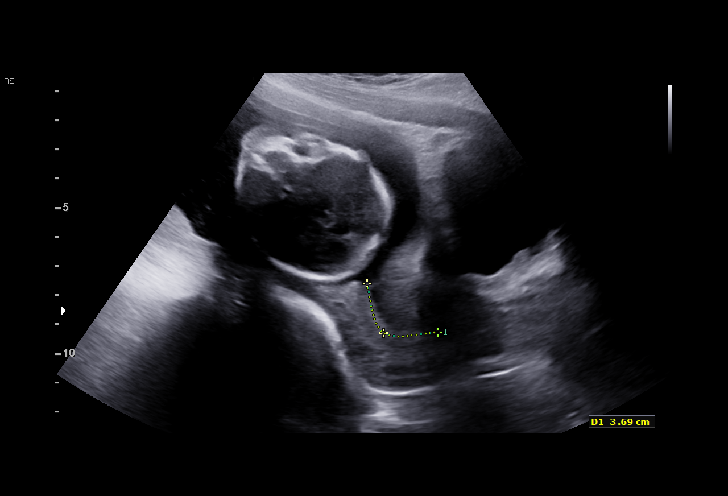
[im 12/103]
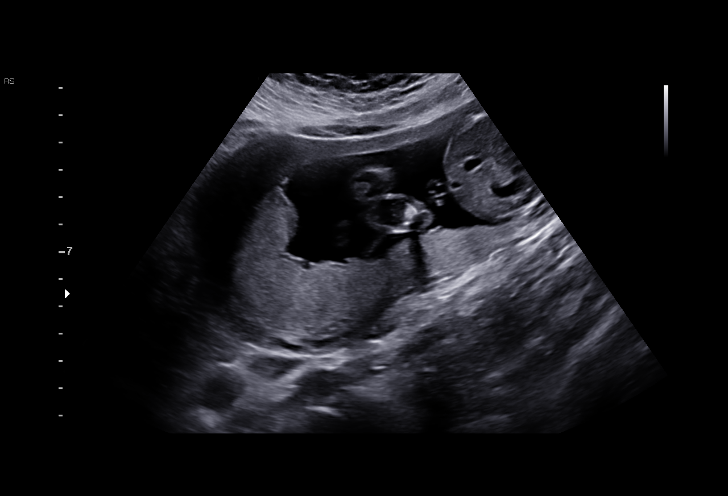
[im 19/103]
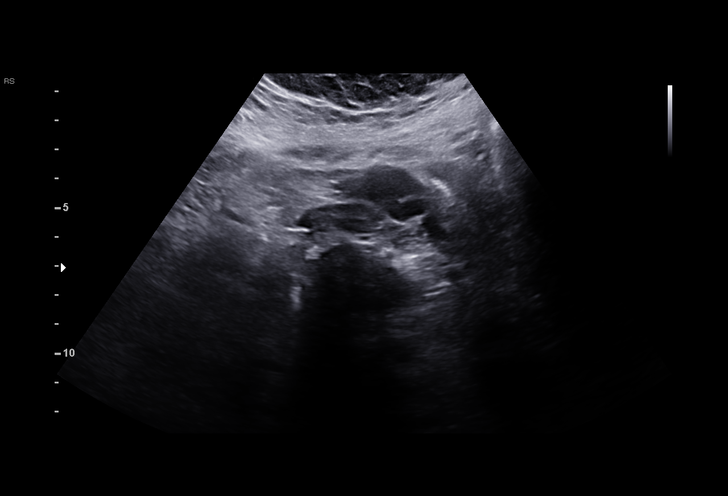
[im 27/103]
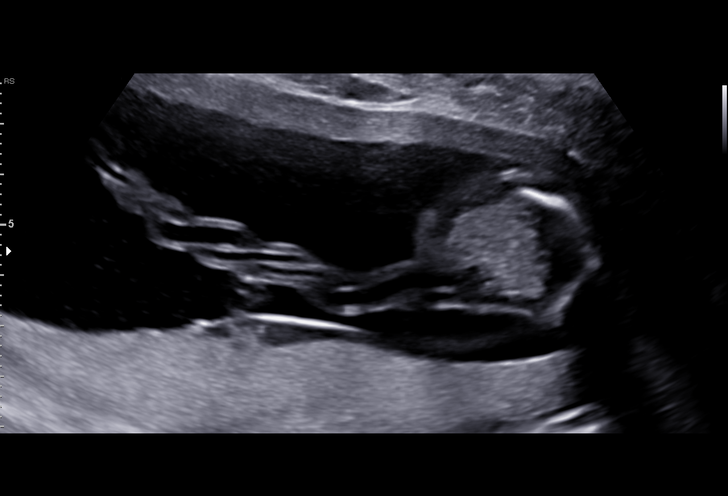
[im 35/103]
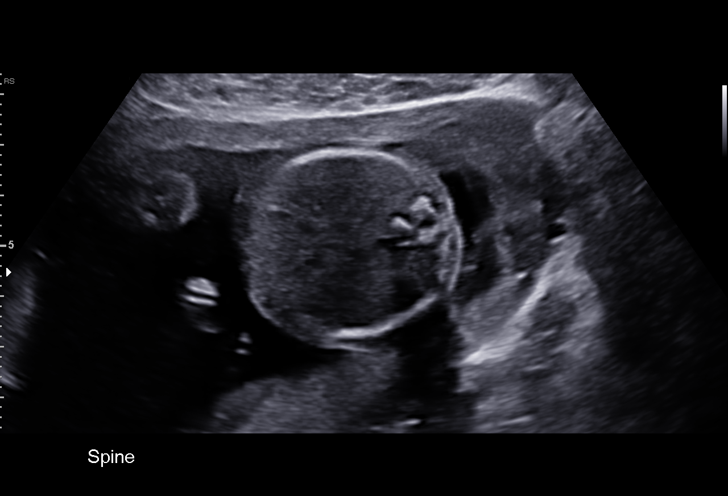
[im 42/103]
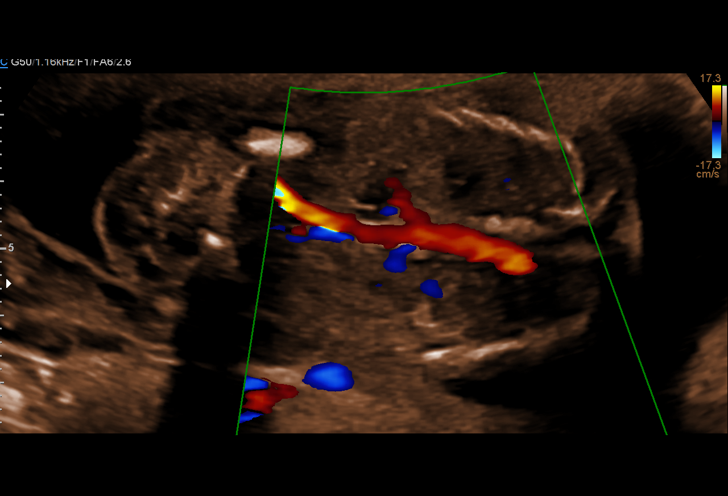
[im 50/103]
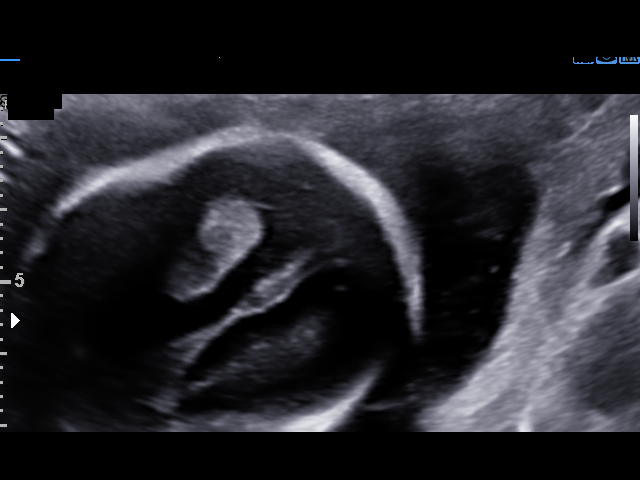
[im 57/103]
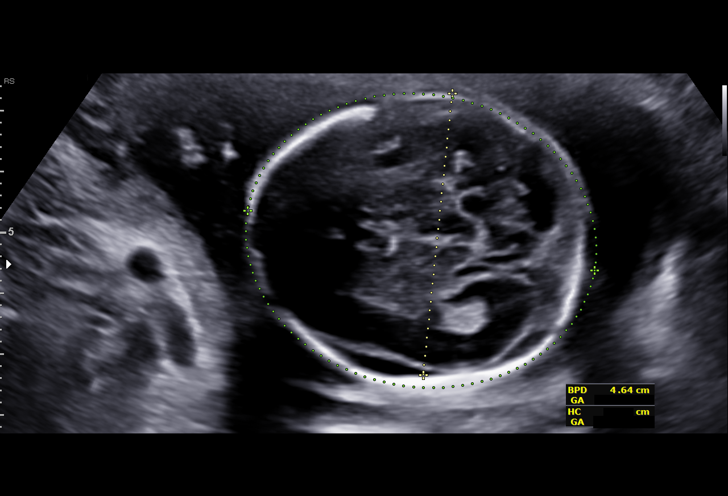
[im 65/103]
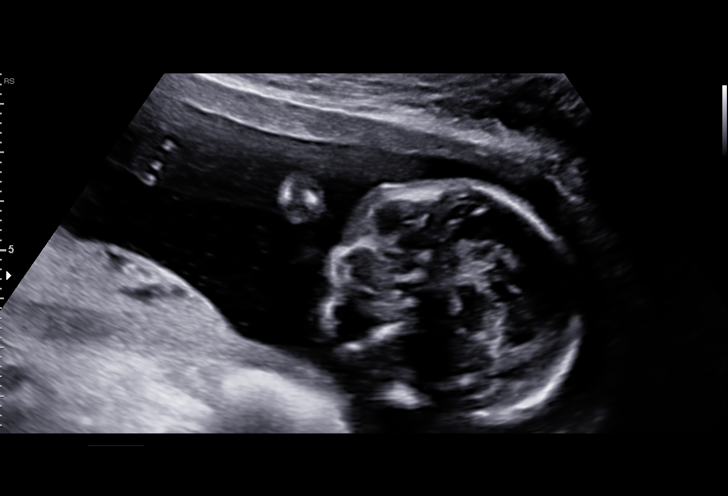
[im 72/103]
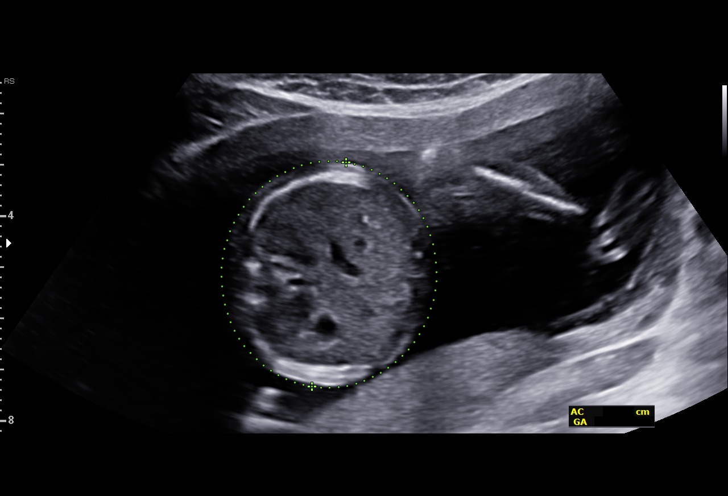
[im 80/103]
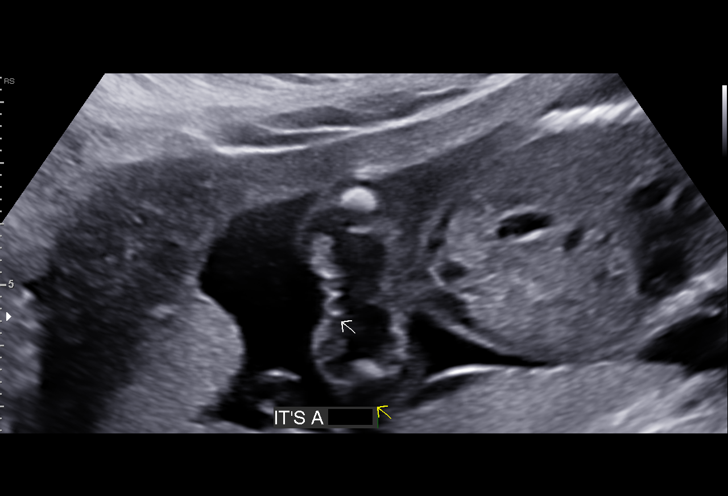
[im 87/103]
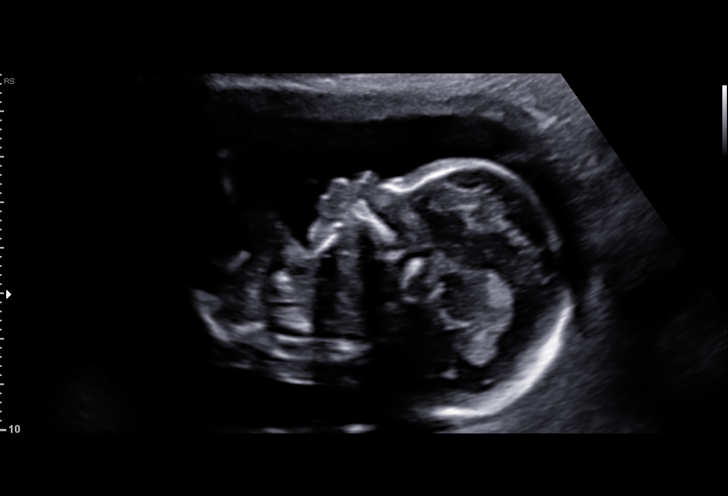
[im 95/103]
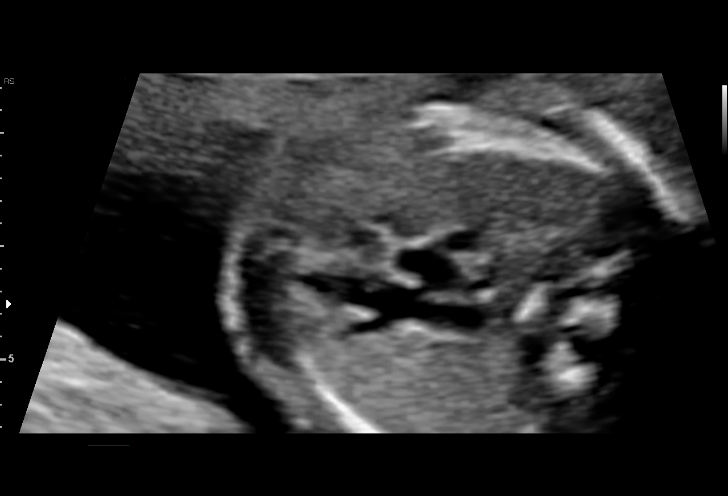
[im 103/103]
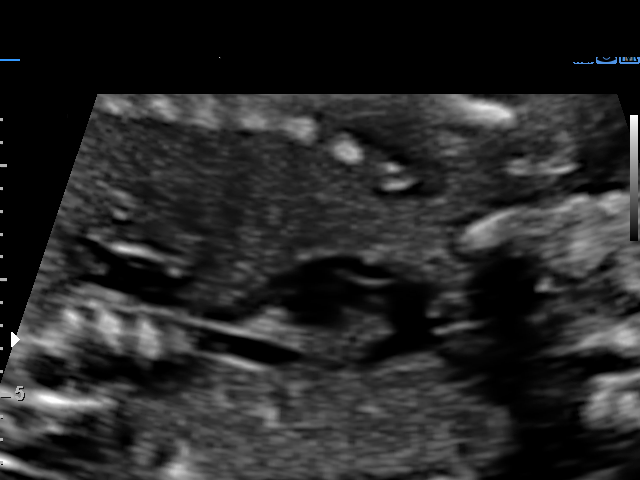

[14 of 28 positions shown; findings below may reference images not displayed]

OB/Gyn Clinic
[REDACTED]

Indications

19 weeks gestation of pregnancy
Encounter for antenatal screening for
malformations
OB History

Gravidity:    4
TOP:          2        Living:  1
Fetal Evaluation

Num Of Fetuses:     1
Fetal Heart         151
Rate(bpm):
Cardiac Activity:   Observed
Presentation:       Cephalic
Placenta:           Posterior, above cervical os
P. Cord Insertion:  Visualized, central

Amniotic Fluid
AFI FV:      Subjectively within normal limits

Largest Pocket(cm)
4.05
Biometry

BPD:        46  mm     G. Age:  19w 6d         71  %    CI:        77.26   %    70 - 86
FL/HC:      18.1   %    16.1 -
HC:      165.7  mm     G. Age:  19w 2d         36  %    HC/AC:      1.22        1.09 -
AC:      135.7  mm     G. Age:  19w 0d         32  %    FL/BPD:     65.2   %
FL:         30  mm     G. Age:  19w 2d         37  %    FL/AC:      22.1   %    20 - 24
HUM:      30.1  mm     G. Age:  20w 0d         64  %
CER:      20.4  mm     G. Age:  19w 3d         50  %
NFT:       3.1  mm
CM:        5.8  mm

Est. FW:     278  gm    0 lb 10 oz      42  %
Gestational Age

LMP:           19w 3d        Date:  05/09/17                 EDD:   02/13/18
U/S Today:     19w 3d                                        EDD:   02/13/18
Best:          19w 3d     Det. By:  LMP  (05/09/17)          EDD:   02/13/18
Anatomy

Cranium:               Appears normal         Aortic Arch:            Appears normal
Cavum:                 Appears normal         Ductal Arch:            Not well visualized
Ventricles:            Appears normal         Diaphragm:              Appears normal
Choroid Plexus:        Bilateral choroid      Stomach:                Appears normal, left
plexus cysts
sided
Cerebellum:            Appears normal         Abdomen:                Appears normal
Posterior Fossa:       Appears normal         Abdominal Wall:         Appears nml (cord
insert, abd wall)
Nuchal Fold:           Appears normal         Cord Vessels:           Appears normal (3
vessel cord)
Face:                  Appears normal         Kidneys:                Appear normal
(orbits and profile)
Lips:                  Appears normal         Bladder:                Appears normal
Thoracic:              Appears normal         Spine:                  Appears normal
Heart:                 Echogenic focus        Upper Extremities:      Appears normal
in LV
RVOT:                  Not well visualized    Lower Extremities:      Appears normal
LVOT:                  Not well visualized

Other:  Fetus appears to be a female. Heels and 5th digit visualized. Open
hands visualized. Technically difficult due to fetal position.
Cervix Uterus Adnexa

Cervix
Length:           3.69  cm.
Normal appearance by transabdominal scan.

Uterus
No abnormality visualized.

Left Ovary
Not visualized.

Right Ovary
Within normal limits.

Adnexa:       No abnormality visualized. No adnexal mass
visualized.
Impression

Singleton intrauterine pregnancy at 19+3 weeks, here for
anatomic survey
Review of the anatomy shows bilateral choroid plexus cysts.
There is a nonpathologic echogenic intracardiac focus.
Otherwise ther are no sonographic markers for aneuploidy or
structural anomalies
Amniotic fluid volume is normal
Estimated fetal weight is 278g which is growth in the 42nd
percentile
Recommendations

The risk for aneuploidy in this patient is minimal, given the
isolated CPC and the low risk maternal serum marker screen.
Recommend follow-up ultrasound examination in 4 weeks to
complete anatomic survey

## 2019-06-07 ENCOUNTER — Telehealth: Payer: Self-pay | Admitting: Family Medicine

## 2019-06-07 NOTE — Telephone Encounter (Signed)
Attempted to call patient to get her a MyChart visit. Left a message for her to call the office to get scheduled.

## 2019-09-17 ENCOUNTER — Other Ambulatory Visit: Payer: Self-pay | Admitting: Lactation Services

## 2019-09-17 ENCOUNTER — Telehealth: Payer: Self-pay | Admitting: Obstetrics and Gynecology

## 2019-09-17 MED ORDER — NORGESTIMATE-ETH ESTRADIOL 0.25-35 MG-MCG PO TABS: 1.0000 | ORAL_TABLET | Freq: Every day | ORAL | 2 refills | Status: DC

## 2019-09-17 NOTE — Telephone Encounter (Signed)
The patient called in regards to the refill on medication. She stated she started receiving birth control around April of 2019. [facility] told her she doesn't have any refills remaining. She would like to know if we could issue a refill or provide instructions on what she should do to remain on birth control.

## 2019-09-17 NOTE — Telephone Encounter (Signed)
Called pt in regards to questions pertaining to OCP's LM for pt to call the office to make a follow up appt for yearly exam. LM that her prescription has been sent to the Pharmacy.

## 2019-09-17 NOTE — Progress Notes (Signed)
Ordered BCP's per standing Protocol. Will let pt know that she needs to make a follow up appt for follow up [facility]YN [facility].

## 2019-09-18 ENCOUNTER — Ambulatory Visit (INDEPENDENT_AMBULATORY_CARE_PROVIDER_SITE_OTHER): Payer: Self-pay | Admitting: Advanced Practice Midwife

## 2019-09-18 ENCOUNTER — Encounter: Payer: Self-pay | Admitting: Advanced Practice Midwife

## 2019-09-18 ENCOUNTER — Other Ambulatory Visit: Payer: Self-pay

## 2019-09-18 VITALS — BP 124/72 | HR 92 | Wt 197.0 lb

## 2019-09-18 DIAGNOSIS — Z0000 Encounter for general adult medical examination without abnormal findings: Secondary | ICD-10-CM

## 2019-09-18 DIAGNOSIS — Z01419 Encounter for gynecological examination (general) (routine) without abnormal findings: Secondary | ICD-10-CM

## 2019-09-18 MED ORDER — NORGESTIM-ETH ESTRAD TRIPHASIC 0.18/0.215/0.25 MG-25 MCG PO TABS: 1.0000 | ORAL_TABLET | Freq: Every day | ORAL | 11 refills | Status: AC

## 2019-09-18 NOTE — Progress Notes (Signed)
GYNECOLOGY ANNUAL PREVENTATIVE CARE ENCOUNTER NOTE  Subjective:   Casey Sullivan is a 33 y.o. [phone] female here for a routine annual gynecologic exam.  Current complaints: acne due to ortho-cyclen.   Denies abnormal vaginal bleeding, discharge, pelvic pain, problems with intercourse or other gynecologic concerns.    Gynecologic History No LMP recorded. Contraception: OCP (estrogen/progesterone) Last Pap: 08/2017. Results were: normal Last mammogram: NA age. Results were: NA  Obstetric History OB History  Gravida Para Term Preterm AB Living  4 2 2   2 2   SAB TAB Ectopic Multiple Live Births    2   0 2    # Outcome Date GA Lbr Len/2nd Weight Sex Delivery Anes PTL Lv  4 Term 02/21/18 [redacted]w[redacted]d  7 lb 11.5 oz (3.5 kg) F CS-LTranv EPI  LIV  3 Term 01/17/15 [redacted]w[redacted]d 22:41 / 00:56 6 lb 7.8 oz (2.943 kg) F Vag-Spont EPI  LIV     Birth Comments: Hgb, Normal, FA Newborn Screen Barcode: [id number] Date collected: 01/19/2015   2 TAB           1 TAB             Past Medical History:  Diagnosis Date  . Sickle cell trait [facility])     Past Surgical History:  Procedure Laterality Date  . CESAREAN SECTION N/A 02/21/2018   Procedure: CESAREAN SECTION;  Surgeon: Truett Mainland, DO;  Location: [place];  Service: Obstetrics;  Laterality: N/A;  . NO PAST SURGERIES    . OVUM / OOCYTE RETRIEVAL     egg donation    Current Outpatient Medications on File Prior to Visit  Medication Sig Dispense Refill  . norgestimate-ethinyl estradiol (ORTHO-CYCLEN) 0.25-35 MG-MCG tablet Take 1 tablet by mouth daily. 1 Package 2  . acetaminophen (TYLENOL) 325 MG tablet Take 2 tablets (650 mg total) by mouth every 4 (four) hours as needed (for pain scale < 4). 30 tablet 0  . dicyclomine (BENTYL) 20 MG tablet Take 1 tablet (20 mg total) by mouth 2 (two) times daily as needed (stomach pain). (Patient not taking: Reported on 09/18/2019) 20 tablet 0  . famotidine (PEPCID) 20 MG tablet Take 1 tablet (20 mg total) by  mouth daily. (Patient not taking: Reported on 09/18/2019) 30 tablet 0  . ibuprofen (MOTRIN IB) 200 MG tablet Take 3 tablets (600 mg total) by mouth every 8 (eight) hours as needed. (Patient not taking: Reported on 09/18/2019) 30 tablet 0  . ondansetron (ZOFRAN) 4 MG tablet Take 1 tablet (4 mg total) by mouth every 8 (eight) hours as needed for nausea or vomiting. (Patient not taking: Reported on 09/18/2019) 10 tablet 0  . Prenatal Vit-Fe Fumarate-FA (PRENATAL VITAMINS PLUS) 27-1 MG TABS Take 1 tablet by mouth 1 day or 1 dose. (Patient not taking: Reported on 09/18/2019) 30 tablet 3  . simethicone (GAS-X) 80 MG chewable tablet Chew 1 tablet (80 mg total) by mouth 4 (four) times daily as needed for flatulence. 100 tablet 2   No current facility-administered medications on file prior to visit.     No Known Allergies  Social History   Socioeconomic History  . Marital status: Married    Spouse name: Not on file  . Number of children: Not on file  . Years of education: Not on file  . Highest education level: Not on file  Occupational History  . Not on file  Social Needs  . Financial resource strain: Not on file  . Food insecurity  Worry: Not on file    Inability: Not on file  . Transportation needs    Medical: Not on file    Non-medical: Not on file  Tobacco Use  . Smoking status: Never Smoker  . Smokeless tobacco: Never Used  Substance and Sexual Activity  . Alcohol use: Yes    Comment: occasioanl, none with preg  . Drug use: No  . Sexual activity: Yes    Birth control/protection: Pill  Lifestyle  . Physical activity    Days per week: Not on file    Minutes per session: Not on file  . Stress: Not on file  Relationships  . Social Herbalist on phone: Not on file    Gets together: Not on file    Attends religious service: Not on file    Active member of club or organization: Not on file    Attends meetings of clubs or organizations: Not on file    Relationship  status: Not on file  . Intimate partner violence    Fear of current or ex partner: Not on file    Emotionally abused: Not on file    Physically abused: Not on file    Forced sexual activity: Not on file  Other Topics Concern  . Not on file  Social History Narrative  . Not on file    Family History  Problem Relation Age of Onset  . Stroke Father     The following portions of the patient's history were reviewed and updated as appropriate: allergies, current medications, past family history, past medical history, past social history, past surgical history and problem list.  Review of Systems Pertinent items noted in HPI and remainder of comprehensive ROS otherwise negative.   Objective:  BP 124/72   Pulse 92   Wt 197 lb (89.4 kg)   BMI 31.80 kg/m    CONSTITUTIONAL: Well-developed, well-nourished female in no acute distress.  HENT:  Normocephalic, atraumatic, External right and left ear normal. Oropharynx is clear and moist EYES: Conjunctivae and EOM are normal. Pupils are equal, round, and reactive to light. No scleral icterus.  NECK: Normal range of motion, supple, no masses.  Normal thyroid.  SKIN: Skin is warm and dry. No rash noted. Not diaphoretic. No erythema. No pallor. NEUROLOGIC: Alert and oriented to person, place, and time. Normal reflexes, muscle tone coordination. No cranial nerve deficit noted. PSYCHIATRIC: Normal mood and affect. Normal behavior. Normal judgment and thought content. CARDIOVASCULAR: Normal heart rate noted, regular rhythm RESPIRATORY: Clear to auscultation bilaterally. Effort and breath sounds normal, no problems with respiration noted. BREASTS: Symmetric in size. No masses, skin changes, nipple drainage, or lymphadenopathy. ABDOMEN: Soft, normal bowel sounds, no distention noted.  No tenderness, rebound or guarding.  MUSCULOSKELETAL: Normal range of motion. No tenderness.  No cyanosis, clubbing, or edema.  2+ distal pulses.   Assessment and  Plan:  1. Encounter for annual health examination - RX: ortho tri cyclen 1 pack with 11RF.    Routine preventative health maintenance measures emphasized. Please refer to After Visit Summary for other counseling recommendations.   Marcille Buffy DNP, CNM  09/18/19  1:34 PM

## 2020-06-16 ENCOUNTER — Other Ambulatory Visit: Payer: Self-pay | Admitting: Obstetrics and Gynecology

## 2020-06-17 ENCOUNTER — Other Ambulatory Visit: Payer: Self-pay | Admitting: Obstetrics

## 2020-06-17 DIAGNOSIS — Z3041 Encounter for surveillance of contraceptive pills: Secondary | ICD-10-CM

## 2020-06-17 MED ORDER — NORGESTIMATE-ETH ESTRADIOL 0.25-35 MG-MCG PO TABS: 1.0000 | ORAL_TABLET | Freq: Every day | ORAL | 11 refills | Status: AC

## 2020-07-02 ENCOUNTER — Telehealth: Payer: Self-pay

## 2020-07-02 NOTE — Telephone Encounter (Signed)
Called pt to confirm which Rx she was needing refilled & she advised that it was [employer] (BC Pills), she stated [facility] told her that she no longer had any refills, so called [facility] verified & they stated that they see that she has a standing order & will process refill. Pt verbalized understanding.

## 2020-07-02 NOTE — Telephone Encounter (Signed)
Pt. Called thru Nurse line on 07/01/20 @ 9:10am requesting refill on Rx but it was not clear on which one she was requesting.

## 2020-09-18 ENCOUNTER — Other Ambulatory Visit: Payer: Self-pay | Admitting: Obstetrics and Gynecology

## 2020-09-18 DIAGNOSIS — O30109 Triplet pregnancy, unspecified number of placenta and unspecified number of amniotic sacs, unspecified trimester: Secondary | ICD-10-CM

## 2020-10-03 ENCOUNTER — Encounter: Payer: Self-pay | Admitting: *Deleted

## 2020-10-07 ENCOUNTER — Other Ambulatory Visit: Payer: Self-pay | Admitting: *Deleted

## 2020-10-07 ENCOUNTER — Ambulatory Visit (HOSPITAL_BASED_OUTPATIENT_CLINIC_OR_DEPARTMENT_OTHER): Payer: Medicaid Other | Admitting: Maternal & Fetal Medicine

## 2020-10-07 ENCOUNTER — Other Ambulatory Visit: Payer: Self-pay

## 2020-10-07 ENCOUNTER — Ambulatory Visit: Payer: Medicaid Other | Attending: Obstetrics and Gynecology

## 2020-10-07 ENCOUNTER — Ambulatory Visit: Payer: Medicaid Other | Admitting: *Deleted

## 2020-10-07 ENCOUNTER — Other Ambulatory Visit: Payer: Self-pay | Admitting: Obstetrics and Gynecology

## 2020-10-07 ENCOUNTER — Encounter: Payer: Self-pay | Admitting: *Deleted

## 2020-10-07 VITALS — BP 121/63 | HR 100

## 2020-10-07 DIAGNOSIS — O30109 Triplet pregnancy, unspecified number of placenta and unspecified number of amniotic sacs, unspecified trimester: Secondary | ICD-10-CM

## 2020-10-07 DIAGNOSIS — O30111 Triplet pregnancy with two or more monochorionic fetuses, first trimester: Secondary | ICD-10-CM

## 2020-10-07 DIAGNOSIS — O30129 Triplet pregnancy with two or more monoamniotic fetuses, unspecified trimester: Secondary | ICD-10-CM

## 2020-10-07 DIAGNOSIS — E119 Type 2 diabetes mellitus without complications: Secondary | ICD-10-CM

## 2020-10-07 DIAGNOSIS — D271 Benign neoplasm of left ovary: Secondary | ICD-10-CM

## 2020-10-07 DIAGNOSIS — O4591 Premature separation of placenta, unspecified, first trimester: Secondary | ICD-10-CM

## 2020-10-07 DIAGNOSIS — D573 Sickle-cell trait: Secondary | ICD-10-CM | POA: Diagnosis present

## 2020-10-07 DIAGNOSIS — O99211 Obesity complicating pregnancy, first trimester: Secondary | ICD-10-CM | POA: Diagnosis not present

## 2020-10-07 DIAGNOSIS — Z3A12 12 weeks gestation of pregnancy: Secondary | ICD-10-CM

## 2020-10-07 DIAGNOSIS — O3481 Maternal care for other abnormalities of pelvic organs, first trimester: Secondary | ICD-10-CM

## 2020-10-07 DIAGNOSIS — O30101 Triplet pregnancy, unspecified number of placenta and unspecified number of amniotic sacs, first trimester: Secondary | ICD-10-CM

## 2020-10-07 DIAGNOSIS — O34219 Maternal care for unspecified type scar from previous cesarean delivery: Secondary | ICD-10-CM | POA: Diagnosis not present

## 2020-10-07 DIAGNOSIS — Z363 Encounter for antenatal screening for malformations: Secondary | ICD-10-CM

## 2020-10-07 NOTE — Progress Notes (Signed)
MFM Consultation  Reason for request: Diamniotic dichorionic triplet pregnancy Requesting provider: Dr. Crawford Givens Date of service: 10/07/20   Casey Sullivan is a 34 yo G5P2 who is 12 w 4 days dated by a an 8 weeks exam is here in consultation regarding [place] triplet pregnancy at the request of Dr. Terri Piedra.  Casey Sullivan is doing well today. She denies vaginal bleeding or loss of fluid. She is excited about the pregnancy.  She had an early ultrasound 8w 6 days with an EDD of 04/17/21.  Vitals with BMI 10/07/2020 09/18/2019 06/09/2018  Height - - -  Weight - 197 lbs -  BMI - - -  Systolic 474 259 563  Diastolic 63 72 83  Pulse 875 92 59   CBC Latest Ref Rng & Units 06/09/2018 02/22/2018 02/21/2018  WBC 4.0 - 10.5 K/uL 15.4(H) 19.0(H) 18.9(H)  Hemoglobin 12.0 - 15.0 g/dL 13.2 9.2(L) 11.9(L)  Hematocrit 36 - 46 % 38.5 26.2(L) 34.9(L)  Platelets 150 - 400 K/uL 275 236 258   CMP Latest Ref Rng & Units 06/09/2018 02/23/2018 02/22/2018  Glucose 70 - 99 mg/dL 115(H) - 83  BUN 6 - 20 mg/dL 11 - 14  Creatinine 0.44 - 1.00 mg/dL 0.72 0.97 1.16(H)  Sodium 135 - 145 mmol/L 140 - 138  Potassium 3.5 - 5.1 mmol/L 4.2 - 3.9  Chloride 98 - 111 mmol/L 106 - 108  CO2 22 - 32 mmol/L 25 - 22  Calcium 8.9 - 10.3 mg/dL 9.6 - 8.1(L)  Total Protein 6.5 - 8.1 g/dL 8.5(H) - 5.1(L)  Total Bilirubin 0.3 - 1.2 mg/dL 0.4 - 0.4  Alkaline Phos 38 - 126 U/L 43 - 119  AST 15 - 41 U/L 24 - 17  ALT 0 - 44 U/L 27 - 10(L)   OB History  Gravida Para Term Preterm AB Living  5 2 2  0 2 2  SAB TAB Ectopic Multiple Live Births  0 2 0 0 2    # Outcome Date GA Lbr Len/2nd Weight Sex Delivery Anes PTL Lv  5 Current           4 Term 02/21/18 [redacted]w[redacted]d  7 lb 11.5 oz (3.5 kg) F CS-LTranv EPI  LIV     Name: Gillispie,GIRL Elnora     Apgar1: 7  Apgar5: 8  3 Term 01/17/15 [redacted]w[redacted]d 22:41 / 00:56 6 lb 7.8 oz (2.943 kg) F Vag-Spont EPI  LIV     Birth Comments: Hgb, Normal, FA Newborn Screen Barcode: [id number] Date  collected: 01/19/2015      Apgar1: 8  Apgar5: 8  2 TAB           1 TAB            Past Medical History:  Diagnosis Date  . Sickle cell trait [facility])    Past Surgical History:  Procedure Laterality Date  . CESAREAN SECTION N/A 02/21/2018   Procedure: CESAREAN SECTION;  Surgeon: Truett Mainland, DO;  Location: [place];  Service: Obstetrics;  Laterality: N/A;  . NO PAST SURGERIES    . OVUM / OOCYTE RETRIEVAL     egg donation   Family History  Problem Relation Age of Onset  . Stroke Father    Social History   Socioeconomic History  . Marital status: Married    Spouse name: Not on file  . Number of children: Not on file  . Years of education: Not on file  . Highest education level: Not on file  Occupational History  . Not on file  Tobacco Use  . Smoking status: Never Smoker  . Smokeless tobacco: Never Used  Vaping Use  . Vaping Use: Never used  Substance and Sexual Activity  . Alcohol use: Yes    Comment: occasioanl, none with preg  . Drug use: No  . Sexual activity: Yes    Birth control/protection: Pill  Other Topics Concern  . Not on file  Social History Narrative  . Not on file   Social Determinants of Health   Financial Resource Strain:   . Difficulty of Paying Living Expenses: Not on file  Food Insecurity:   . Worried About Charity fundraiser in the Last Year: Not on file  . Ran Out of Food in the Last Year: Not on file  Transportation Needs:   . Lack of Transportation (Medical): Not on file  . Lack of Transportation (Non-Medical): Not on file  Physical Activity:   . Days of Exercise per Week: Not on file  . Minutes of Exercise per Session: Not on file  Stress:   . Feeling of Stress : Not on file  Social Connections:   . Frequency of Communication with Friends and Family: Not on file  . Frequency of Social Gatherings with Friends and Family: Not on file  . Attends Religious Services: Not on file  . Active Member of Clubs or Organizations: Not  on file  . Attends Archivist Meetings: Not on file  . Marital Status: Not on file  Intimate Partner Violence:   . Fear of Current or Ex-Partner: Not on file  . Emotionally Abused: Not on file  . Physically Abused: Not on file  . Sexually Abused: Not on file    Current Outpatient Medications (Endocrine & Metabolic):  .  norgestimate-ethinyl estradiol (ESTARYLLA) 0.25-35 MG-MCG tablet, Take 1 tablet by mouth daily. (Patient not taking: Reported on 10/07/2020) .  Norgestimate-Ethinyl Estradiol Triphasic 0.18/0.215/0.25 MG-25 MCG tab, Take 1 tablet by mouth daily. (Patient not taking: Reported on 10/07/2020)    Current Outpatient Medications (Analgesics):  .  acetaminophen (TYLENOL) 325 MG tablet, Take 2 tablets (650 mg total) by mouth every 4 (four) hours as needed (for pain scale < 4). (Patient not taking: Reported on 10/07/2020) .  ibuprofen (MOTRIN IB) 200 MG tablet, Take 3 tablets (600 mg total) by mouth every 8 (eight) hours as needed. (Patient not taking: Reported on 09/18/2019)   Current Outpatient Medications (Other):  .  dicyclomine (BENTYL) 20 MG tablet, Take 1 tablet (20 mg total) by mouth 2 (two) times daily as needed (stomach pain). (Patient not taking: Reported on 09/18/2019) .  famotidine (PEPCID) 20 MG tablet, Take 1 tablet (20 mg total) by mouth daily. (Patient not taking: Reported on 09/18/2019) .  metoCLOPramide (REGLAN) 10 MG tablet, Take 10 mg by mouth 4 (four) times daily. .  ondansetron (ZOFRAN) 4 MG tablet, Take 1 tablet (4 mg total) by mouth every 8 (eight) hours as needed for nausea or vomiting. (Patient not taking: Reported on 09/18/2019) .  Prenatal Vit-Fe Fumarate-FA (PRENATAL VITAMINS PLUS) 27-1 MG TABS, Take 1 tablet by mouth 1 day or 1 dose. .  simethicone (GAS-X) 80 MG chewable tablet, Chew 1 tablet (80 mg total) by mouth 4 (four) times daily as needed for flatulence.  No Known Allergies   Imaging: Diamniotic Dichorionic triplet pregnancy Dating  EDD 04/17/21 based on 8 week ultrasound: A/B pair- monoamniotic, lower NT A: 1.22mm , NT B:0.98 mm NT C:1.47 mm  Triplet C- Diamniotic   Nasal bone present in all A, B & C.  Bladder, stomach and amniotic fluid present.  Anatomy limited due to early gestation. Suspected  Single umbilical artery in triplet C.   Subchorionic hemorrhage observed between Triplet A/B and C's sacs.   Known left dermoid observed ~ 5cm.   Impression/Counseling  Casey Sullivan is excited about this pregnancy.  Triplet pregnancy:  Triplets have a more complicated in utero stay than that of singletons, with higher incidences of growth restriction, congenital anomalies, and intrauterine demise. Monozygotic multiplets are at higher risk for complications than their dizygotic counterparts.  The increased morbidity and mortality rates in monochorionic multiplets are primarily related to, vascular anastomoses, which are virtually absent in dichorionic twin pregnancies and, in this case cord entanglement due to monoamniotic nature of Casey Sullivan's pregnancy.  These vascular anastomoses are randomly distributed and can cause significant blood volume shifts between the fetuses, leading to unique complications such as twin-twin transfusion syndrome (TTTS), twin reversed arterial perfusion (TRAP), and acute fetofetal transfusion after single intrauterine fetal demise (sIUFD).    Secondly, the monoamniotic twins pair possess and additional risk and a high perinatal mortality rate.The high mortality rate is generally due to umbilical cord entanglement with subsequent occlusion of blood flow to one or both twins.  Case series report a mortality rate between 10-30% and increases with gestational age beyond 70-34 weeks.    Therefore given the monoamniotic twin pair we recommend inpatient admission from viability until planned delivery between 33 and 34 weeks.  This finding was discussed with the patient who agreed to inpatient admission.    Given the higher prevalence of congenital heart disease in monoamniotic twins, screening fetal echocardiography is also recommended.   Further we discussed additional risk generally related to multiples including fetal growth restriction and severe growth discordancy (defined as a weight difference of 20-25% or more). It occurs in about 12% of twins and 34% of triplets. Fetal risks associated with growth restriction for multiplets are similar to singletons and include intrauterine fetal demise and neurologic morbidity due to chronic oxygen and nutrient deprivation.  Specifically, the prevalence of cerebral palsy in triplets is 47 times greater and in twins 8 times greater than in singletons.  The optimal mode of birth in multiple pregnancy is controversial. For triplets and higher-order multiple gestations, the most frequent mode of delivery is cesarean section, although no randomized studies are available to support this mode of birth over vaginal birth. Reports suggest that the risk of lower Apgar scores in higher-order multiple gestations is reduced with cesarean delivery; in addition, there are fewer perinatal deaths.   Lastly, we discussed medical risk associated with multiples include gestational diabetes, preterm labor and preeclampsia. Therefore, we recommend prevention of therapy to include daily low dose ASA.   Recommendations: 1) Follow up limited exam in 2 and 4 weeks. 2) Detailed anatomy at 6 weeks 3) TTTS screening beginning at 16 weeks 4) Daily low dose aspirin 5) Hospital admission between 24-26 weeks 6) Consider delivery at 32 weeks 7) Fetal echocardiogram between 22-24 weeks. 8) Cervical length measurements every 2 weeks beginning at 16 weeks.    I spent 60 minutes with > 50% in face to face consultation  All questions answered.  Vikki Ports, MD

## 2020-10-24 ENCOUNTER — Other Ambulatory Visit: Payer: Self-pay | Admitting: *Deleted

## 2020-10-24 ENCOUNTER — Ambulatory Visit: Payer: Medicaid Other | Attending: Obstetrics and Gynecology

## 2020-10-24 ENCOUNTER — Other Ambulatory Visit: Payer: Self-pay | Admitting: Maternal & Fetal Medicine

## 2020-10-24 ENCOUNTER — Other Ambulatory Visit: Payer: Self-pay

## 2020-10-24 ENCOUNTER — Ambulatory Visit: Payer: Medicaid Other | Admitting: *Deleted

## 2020-10-24 ENCOUNTER — Encounter: Payer: Self-pay | Admitting: *Deleted

## 2020-10-24 VITALS — BP 120/58 | HR 98

## 2020-10-24 DIAGNOSIS — O30102 Triplet pregnancy, unspecified number of placenta and unspecified number of amniotic sacs, second trimester: Secondary | ICD-10-CM

## 2020-10-24 DIAGNOSIS — Z862 Personal history of diseases of the blood and blood-forming organs and certain disorders involving the immune mechanism: Secondary | ICD-10-CM

## 2020-10-24 DIAGNOSIS — O30101 Triplet pregnancy, unspecified number of placenta and unspecified number of amniotic sacs, first trimester: Secondary | ICD-10-CM

## 2020-10-24 DIAGNOSIS — E669 Obesity, unspecified: Secondary | ICD-10-CM

## 2020-10-24 DIAGNOSIS — O99212 Obesity complicating pregnancy, second trimester: Secondary | ICD-10-CM | POA: Diagnosis not present

## 2020-10-24 DIAGNOSIS — O3482 Maternal care for other abnormalities of pelvic organs, second trimester: Secondary | ICD-10-CM

## 2020-10-24 DIAGNOSIS — O30112 Triplet pregnancy with two or more monochorionic fetuses, second trimester: Secondary | ICD-10-CM | POA: Diagnosis not present

## 2020-10-24 DIAGNOSIS — D279 Benign neoplasm of unspecified ovary: Secondary | ICD-10-CM

## 2020-10-24 DIAGNOSIS — Z3A15 15 weeks gestation of pregnancy: Secondary | ICD-10-CM

## 2020-10-24 DIAGNOSIS — O4592 Premature separation of placenta, unspecified, second trimester: Secondary | ICD-10-CM | POA: Diagnosis not present

## 2020-10-24 DIAGNOSIS — O34219 Maternal care for unspecified type scar from previous cesarean delivery: Secondary | ICD-10-CM

## 2020-11-04 ENCOUNTER — Ambulatory Visit: Payer: Medicaid Other | Admitting: *Deleted

## 2020-11-04 ENCOUNTER — Ambulatory Visit: Payer: Medicaid Other | Attending: Obstetrics and Gynecology

## 2020-11-04 ENCOUNTER — Other Ambulatory Visit: Payer: Self-pay

## 2020-11-04 ENCOUNTER — Encounter: Payer: Self-pay | Admitting: *Deleted

## 2020-11-04 VITALS — BP 118/64 | HR 95

## 2020-11-04 DIAGNOSIS — O34219 Maternal care for unspecified type scar from previous cesarean delivery: Secondary | ICD-10-CM | POA: Diagnosis not present

## 2020-11-04 DIAGNOSIS — O3482 Maternal care for other abnormalities of pelvic organs, second trimester: Secondary | ICD-10-CM

## 2020-11-04 DIAGNOSIS — O30101 Triplet pregnancy, unspecified number of placenta and unspecified number of amniotic sacs, first trimester: Secondary | ICD-10-CM

## 2020-11-04 DIAGNOSIS — O30102 Triplet pregnancy, unspecified number of placenta and unspecified number of amniotic sacs, second trimester: Secondary | ICD-10-CM | POA: Insufficient documentation

## 2020-11-04 DIAGNOSIS — O99212 Obesity complicating pregnancy, second trimester: Secondary | ICD-10-CM | POA: Diagnosis not present

## 2020-11-04 DIAGNOSIS — Z3A16 16 weeks gestation of pregnancy: Secondary | ICD-10-CM

## 2020-11-04 DIAGNOSIS — O30112 Triplet pregnancy with two or more monochorionic fetuses, second trimester: Secondary | ICD-10-CM | POA: Diagnosis not present

## 2020-11-04 DIAGNOSIS — E669 Obesity, unspecified: Secondary | ICD-10-CM

## 2020-11-04 DIAGNOSIS — O4592 Premature separation of placenta, unspecified, second trimester: Secondary | ICD-10-CM

## 2020-11-04 DIAGNOSIS — Z862 Personal history of diseases of the blood and blood-forming organs and certain disorders involving the immune mechanism: Secondary | ICD-10-CM

## 2020-11-04 DIAGNOSIS — O321XX1 Maternal care for breech presentation, fetus 1: Secondary | ICD-10-CM

## 2020-11-04 DIAGNOSIS — O322XX3 Maternal care for transverse and oblique lie, fetus 3: Secondary | ICD-10-CM

## 2020-11-04 DIAGNOSIS — O322XX2 Maternal care for transverse and oblique lie, fetus 2: Secondary | ICD-10-CM

## 2020-11-04 DIAGNOSIS — D279 Benign neoplasm of unspecified ovary: Secondary | ICD-10-CM

## 2020-11-06 ENCOUNTER — Encounter: Payer: Self-pay | Admitting: Obstetrics & Gynecology

## 2020-11-06 DIAGNOSIS — O30109 Triplet pregnancy, unspecified number of placenta and unspecified number of amniotic sacs, unspecified trimester: Secondary | ICD-10-CM | POA: Insufficient documentation

## 2020-11-06 DIAGNOSIS — O43229 Placenta increta, unspecified trimester: Secondary | ICD-10-CM | POA: Insufficient documentation

## 2020-11-19 ENCOUNTER — Ambulatory Visit: Payer: Medicaid Other

## 2020-11-24 ENCOUNTER — Ambulatory Visit: Payer: Medicaid Other

## 2020-11-24 ENCOUNTER — Other Ambulatory Visit: Payer: Medicaid Other

## 2021-01-26 HISTORY — PX: ABDOMINAL HYSTERECTOMY: SHX81

## 2022-07-16 ENCOUNTER — Ambulatory Visit (HOSPITAL_COMMUNITY)
Admission: EM | Admit: 2022-07-16 | Discharge: 2022-07-16 | Disposition: A | Discharge status: Other Acute Inpatient Hospital | Payer: Medicaid Other | Attending: Internal Medicine | Admitting: Internal Medicine

## 2022-07-16 ENCOUNTER — Other Ambulatory Visit: Payer: Self-pay

## 2022-07-16 ENCOUNTER — Emergency Department (HOSPITAL_COMMUNITY)
Admission: EM | Admit: 2022-07-16 | Discharge: 2022-07-16 | Disposition: A | Discharge status: Home / Self Care | Payer: Medicaid Other | Attending: Emergency Medicine | Admitting: Emergency Medicine

## 2022-07-16 ENCOUNTER — Encounter (HOSPITAL_COMMUNITY): Payer: Self-pay | Admitting: *Deleted

## 2022-07-16 ENCOUNTER — Emergency Department (HOSPITAL_COMMUNITY): Payer: Medicaid Other

## 2022-07-16 DIAGNOSIS — R112 Nausea with vomiting, unspecified: Secondary | ICD-10-CM | POA: Diagnosis not present

## 2022-07-16 DIAGNOSIS — R5383 Other fatigue: Secondary | ICD-10-CM | POA: Insufficient documentation

## 2022-07-16 DIAGNOSIS — R519 Headache, unspecified: Secondary | ICD-10-CM

## 2022-07-16 DIAGNOSIS — R42 Dizziness and giddiness: Secondary | ICD-10-CM | POA: Diagnosis not present

## 2022-07-16 DIAGNOSIS — Z7982 Long term (current) use of aspirin: Secondary | ICD-10-CM | POA: Diagnosis not present

## 2022-07-16 DIAGNOSIS — R531 Weakness: Secondary | ICD-10-CM | POA: Insufficient documentation

## 2022-07-16 DIAGNOSIS — Z20822 Contact with and (suspected) exposure to covid-19: Secondary | ICD-10-CM | POA: Diagnosis not present

## 2022-07-16 DIAGNOSIS — R55 Syncope and collapse: Secondary | ICD-10-CM

## 2022-07-16 LAB — CBC
HCT: 36.7 % (ref 36.0–46.0)
Hemoglobin: 11.7 g/dL — ABNORMAL LOW (ref 12.0–15.0)
MCH: 23.5 pg — ABNORMAL LOW (ref 26.0–34.0)
MCHC: 31.9 g/dL (ref 30.0–36.0)
MCV: 73.8 fL — ABNORMAL LOW (ref 80.0–100.0)
Platelets: 300 10*3/uL (ref 150–400)
RBC: 4.97 MIL/uL (ref 3.87–5.11)
RDW: 15.7 % — ABNORMAL HIGH (ref 11.5–15.5)
WBC: 5.3 10*3/uL (ref 4.0–10.5)
nRBC: 0 % (ref 0.0–0.2)

## 2022-07-16 LAB — BASIC METABOLIC PANEL
Anion gap: 13 (ref 5–15)
BUN: 8 mg/dL (ref 6–20)
CO2: 25 mmol/L (ref 22–32)
Calcium: 9.6 mg/dL (ref 8.9–10.3)
Chloride: 102 mmol/L (ref 98–111)
Creatinine, Ser: 0.72 mg/dL (ref 0.44–1.00)
GFR, Estimated: >60 mL/min (ref >60)
Glucose, Bld: 84 mg/dL (ref 70–99)
Potassium: 3.6 mmol/L (ref 3.5–5.1)
Sodium: 140 mmol/L (ref 135–145)

## 2022-07-16 LAB — URINALYSIS, ROUTINE W REFLEX MICROSCOPIC
Bilirubin Urine: NEGATIVE
Glucose, UA: NEGATIVE mg/dL
Hgb urine dipstick: NEGATIVE
Ketones, ur: NEGATIVE mg/dL
Leukocytes,Ua: NEGATIVE
Nitrite: NEGATIVE
Protein, ur: NEGATIVE mg/dL
Specific Gravity, Urine: 1.016 (ref 1.005–1.030)
pH: 5 (ref 5.0–8.0)

## 2022-07-16 LAB — HCG, SERUM, QUALITATIVE: Preg, Serum: NEGATIVE

## 2022-07-16 LAB — CBG MONITORING, ED: Glucose-Capillary: 79 mg/dL (ref 70–99)

## 2022-07-16 LAB — SARS CORONAVIRUS 2 BY RT PCR: SARS Coronavirus 2 by RT PCR: NEGATIVE

## 2022-07-16 LAB — TROPONIN I (HIGH SENSITIVITY): Troponin I (High Sensitivity): <2 ng/L (ref <18)

## 2022-07-16 MED ORDER — IBUPROFEN 800 MG PO TABS
ORAL_TABLET | ORAL | Status: AC
  Filled 2022-07-16: qty 1

## 2022-07-16 MED ORDER — IBUPROFEN 800 MG PO TABS
800.0000 mg | ORAL_TABLET | Freq: Once | ORAL | Status: AC
Start: 2022-07-16 — End: 2022-07-16
  Administered 2022-07-16: 800 mg via ORAL

## 2022-07-16 NOTE — Discharge Instructions (Addendum)
Please go to the nearest emergency department for further evaluation of your dizziness and headache.  I am concerned that you may be acutely anemic and would like for you to get urgent blood work performed by the emergency department to further assess for cause of dizziness, headache, near passing out yesterday, nausea, and vomiting.   I hope you feel better!

## 2022-07-16 NOTE — ED Triage Notes (Signed)
Pt states that she woke up yesterday with headache. She started feeling hot, cold and vomiting in the pick up like for her daughter yesterday. She was dizzy yesterday. She did call EMS yesterday to her on the road and they said her blood pressure was elevated.   Today woke up headache today tired, and dizzy.

## 2022-07-16 NOTE — ED Provider Notes (Signed)
[place]    CSN: [id number] Arrival date & time: 07/16/22  1159      History   Chief Complaint Chief Complaint  Patient presents with   Headache    HPI Casey Sullivan is a 36 y.o. female.   Patient presents to urgent care for evaluation of headache that started yesterday upon waking. She was on her way to pick her daughter up from school when she became nauseous and dizzy. She had a couple of episodes of vomiting, cold chills, and diaphoresis during the time she was in the pickup line at daughter's school. Sympotms continued to worsen as she drove home and she pulled her car over to call 911. EMS came to evaluate her and noted that patient's BP was elevated at 160/100. She has 5 children at home including triplets that are 47 months old and admits to increased stress recently. States she felt like her head was "burning" inside yesterday, like a migraine but more severe. She had never had a headache like this in the past. States she has a history of migraine headaches and headache yesterday felt different. She has never seen a neurologist for headaches in the past and has never experienced these symptoms outside of pregnancy. She denies chance of pregnancy and states that she has had a hysterectomy. She woke up with a light headache again today, but not as severe as yesterday. Reports ongoing fatigue, weakness, and dizziness. Denies blood/mucous to the stools. Denies hematemesis. She states that attempting to pay attention to all of her kids all at once makes her feel dizzy but she attributes this to the stress of childcare. Resting makes dizziness better. Reports she was in the hospital   Reports marital issues. She reports feeling very overwhelmed.     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   Triplet pregnancy (diamniotic, dichorionic;  A/B -  mono/mono), antepartum 11/06/2020   Gestational hypertension, antepartum 02/24/2018   Status post primary low transverse cesarean section 02/23/2018   Limited prenatal care, third trimester 01/02/2018   Sickle cell trait ([place]) 08/23/2017   Encounter for supervision of other normal pregnancy, unspecified trimester 08/16/2017    Past Surgical History:  Procedure Laterality Date   ABDOMINAL HYSTERECTOMY  01/26/2021   CESAREAN SECTION N/A 02/21/2018   Procedure: CESAREAN SECTION;  Surgeon: Truett Mainland, DO;  Location: [place];  Service: Obstetrics;  Laterality: N/A;   OVUM / OOCYTE RETRIEVAL     egg donation    OB History     Gravida  5   Para  2   Term  2   Preterm      AB  2   Living  2      SAB      IAB  2   Ectopic      Multiple  0   Live Births  2            Home Medications    Prior to Admission medications   Medication Sig Start Date End Date Taking? Authorizing Provider  acetaminophen (TYLENOL) 325 MG tablet Take 2 tablets (650 mg total) by mouth every 4 (four) hours as needed (for pain scale < 4). Patient not taking: Reported on 10/07/2020 02/24/18   Guadalupe Dawn, MD  aspirin EC 81 MG tablet Take 81 mg by mouth daily.  Swallow whole.    [provider]  dicyclomine (BENTYL) 20 MG tablet Take 1 tablet (20 mg total) by mouth 2 (two) times daily as needed (stomach pain). Patient not taking: Reported on 09/18/2019 06/09/18   Margarita Mail, PA-C  famotidine (PEPCID) 20 MG tablet Take 1 tablet (20 mg total) by mouth daily. Patient not taking: Reported on 09/18/2019 06/09/18   Margarita Mail, PA-C  ibuprofen (MOTRIN IB) 200 MG tablet Take 3 tablets (600 mg total) by mouth every 8 (eight) hours as needed. Patient not taking: Reported on 09/18/2019 02/24/18   Guadalupe Dawn, MD  metoCLOPramide (REGLAN) 10 MG tablet Take 10 mg by mouth 4 (four) times daily.    [provider]  norgestimate-ethinyl estradiol (ESTARYLLA) 0.25-35  MG-MCG tablet Take 1 tablet by mouth daily. Patient not taking: Reported on 10/07/2020 06/17/20   Shelly Bombard, MD  Norgestimate-Ethinyl Estradiol Triphasic 0.18/0.215/0.25 MG-25 MCG tab Take 1 tablet by mouth daily. Patient not taking: Reported on 10/07/2020 09/18/19   Marcille Buffy D, CNM  ondansetron (ZOFRAN) 4 MG tablet Take 1 tablet (4 mg total) by mouth every 8 (eight) hours as needed for nausea or vomiting. Patient not taking: Reported on 09/18/2019 06/09/18   Margarita Mail, PA-C  Prenatal Vit-Fe Fumarate-FA (PRENATAL VITAMINS PLUS) 27-1 MG TABS Take 1 tablet by mouth 1 day or 1 dose. 06/15/14   Poe, Deirdre C, CNM  simethicone (GAS-X) 80 MG chewable tablet Chew 1 tablet (80 mg total) by mouth 4 (four) times daily as needed for flatulence. 02/24/18 02/24/19  Guadalupe Dawn, MD    Family History Family History  Problem Relation Age of Onset   Stroke Father     Social History Social History   Tobacco Use   Smoking status: Never   Smokeless tobacco: Never  Vaping Use   Vaping Use: Never used  Substance Use Topics   Alcohol use: Yes    Comment: Socially   Drug use: No     Allergies   Patient has no known allergies.   Review of Systems Review of Systems  Neurological:  Positive for headaches.     Physical Exam Triage Vital Signs ED Triage Vitals  Enc Vitals Group     BP 07/16/22 1231 120/73     Pulse Rate 07/16/22 1231 78     Resp 07/16/22 1231 18     Temp 07/16/22 1231 98.4 F (36.9 C)     Temp Source 07/16/22 1231 Oral     SpO2 07/16/22 1231 100 %     Weight --      Height --      Head Circumference --      Peak Flow --      Pain Score 07/16/22 1229 0     Pain Loc --      Pain Edu? --      Excl. in [place]? --    No data found.  Updated Vital Signs BP 120/73 (BP Location: Right Arm)   Pulse 78   Temp 98.4 F (36.9 C) (Oral)   Resp 18   LMP 07/20/2020   SpO2 100%   Visual Acuity Right Eye Distance:   Left Eye Distance:   Bilateral Distance:     Right Eye Near:   Left Eye Near:    Bilateral Near:     Physical Exam   UC Treatments / Results  Labs (all labs ordered are listed, but only abnormal results are displayed) Labs Reviewed - No data to display  EKG   Radiology No results found.  Procedures Procedures (including critical care time)  Medications Ordered in UC Medications - No data to display  Initial Impression / Assessment and Plan / UC Course  I have reviewed the triage vital signs and the nursing notes.  Pertinent labs & imaging results that were available during my care of the patient were reviewed by me and considered in my medical decision making (see chart for details).     *** Final Clinical Impressions(s) / UC Diagnoses   Final diagnoses:  None   Discharge Instructions   None    ED Prescriptions   None    PDMP not reviewed this encounter.

## 2022-07-16 NOTE — ED Provider Triage Note (Signed)
Emergency Medicine Provider Triage Evaluation Note  Casey Sullivan , a 36 y.o. female  was evaluated in triage.  Pt complains of dizziness, lightheaded, and weak since yesterday. Was at school pick up when she began to vomit felt weak all over and developed a headache. Today here, with similar symptoms and elevated BP. Sent by UC. Reports a lot of stress at home  Review of Systems  Positive: Headache, nausea, vomiting Negative: Chest pain, sob.  Physical Exam  BP 126/77 (BP Location: Left Arm)   Pulse 68   Temp 98.6 F (37 C) (Oral)   Resp 18   LMP 07/20/2020   SpO2 100%  Gen:   Awake, no distress   Resp:  Normal effort  MSK:   Moves extremities without difficulty  Other:    Medical Decision Making  Medically screening exam initiated at 2:49 PM.  Appropriate orders placed.  Fatuma Dowers was informed that the remainder of the evaluation will be completed by another provider, this initial triage assessment does not replace that evaluation, and the importance of remaining in the ED until their evaluation is complete.     Janeece Fitting, PA-C 07/16/22 1457

## 2022-07-16 NOTE — ED Notes (Signed)
Patient is being discharged from the Urgent Care and sent to the Emergency Department via [facility] . Per Gwenette Greet NP, patient is in need of higher level of care due to Dizziness, dehydration. Patient is aware and verbalizes understanding of plan of care.  Vitals:   07/16/22 1231  BP: 120/73  Pulse: 78  Resp: 18  Temp: 98.4 F (36.9 C)  SpO2: 100%

## 2022-07-16 NOTE — ED Provider Notes (Addendum)
[facility] EMERGENCY DEPARTMENT Provider Note   CSN: [id number] Arrival date & time: 07/16/22  1439     History  Chief Complaint  Patient presents with   Dizziness    Casey Sullivan is a 36 y.o. female.  The history is provided by the patient.  Illness Severity:  Mild Onset quality:  Gradual Duration:  2 days Timing:  Intermittent Progression:  Resolved Chronicity:  New Context:  Feeling fatigued and lightheaded the last two days. Under a good amount of stress. No fever, no chills Relieved by:  Nothing Worsened by:  Nothing Associated symptoms: no abdominal pain, no chest pain, no congestion, no cough, no diarrhea, no ear pain, no fatigue, no fever, no headaches, no loss of consciousness, no myalgias, no nausea, no rash, no rhinorrhea, no shortness of breath, no sore throat, no vomiting and no wheezing        Home Medications Prior to Admission medications   Medication Sig Start Date End Date Taking? Authorizing Provider  acetaminophen (TYLENOL) 325 MG tablet Take 2 tablets (650 mg total) by mouth every 4 (four) hours as needed (for pain scale < 4). Patient not taking: Reported on 10/07/2020 02/24/18   Guadalupe Dawn, MD  aspirin EC 81 MG tablet Take 81 mg by mouth daily. Swallow whole.    [provider]  dicyclomine (BENTYL) 20 MG tablet Take 1 tablet (20 mg total) by mouth 2 (two) times daily as needed (stomach pain). Patient not taking: Reported on 09/18/2019 06/09/18   Margarita Mail, PA-C  famotidine (PEPCID) 20 MG tablet Take 1 tablet (20 mg total) by mouth daily. Patient not taking: Reported on 09/18/2019 06/09/18   Margarita Mail, PA-C  ibuprofen (MOTRIN IB) 200 MG tablet Take 3 tablets (600 mg total) by mouth every 8 (eight) hours as needed. Patient not taking: Reported on 09/18/2019 02/24/18   Guadalupe Dawn, MD  metoCLOPramide (REGLAN) 10 MG tablet Take 10 mg by mouth 4 (four) times daily.    [provider]   norgestimate-ethinyl estradiol (ESTARYLLA) 0.25-35 MG-MCG tablet Take 1 tablet by mouth daily. Patient not taking: Reported on 10/07/2020 06/17/20   Shelly Bombard, MD  Norgestimate-Ethinyl Estradiol Triphasic 0.18/0.215/0.25 MG-25 MCG tab Take 1 tablet by mouth daily. Patient not taking: Reported on 10/07/2020 09/18/19   Marcille Buffy D, CNM  ondansetron (ZOFRAN) 4 MG tablet Take 1 tablet (4 mg total) by mouth every 8 (eight) hours as needed for nausea or vomiting. Patient not taking: Reported on 09/18/2019 06/09/18   Margarita Mail, PA-C  Prenatal Vit-Fe Fumarate-FA (PRENATAL VITAMINS PLUS) 27-1 MG TABS Take 1 tablet by mouth 1 day or 1 dose. 06/15/14   Poe, Deirdre C, CNM  simethicone (GAS-X) 80 MG chewable tablet Chew 1 tablet (80 mg total) by mouth 4 (four) times daily as needed for flatulence. 02/24/18 02/24/19  Guadalupe Dawn, MD      Allergies    Patient has no known allergies.    Review of Systems   Review of Systems  Constitutional:  Negative for fatigue and fever.  HENT:  Negative for congestion, ear pain, rhinorrhea and sore throat.   Respiratory:  Negative for cough, shortness of breath and wheezing.   Cardiovascular:  Negative for chest pain.  Gastrointestinal:  Negative for abdominal pain, diarrhea, nausea and vomiting.  Musculoskeletal:  Negative for myalgias.  Skin:  Negative for rash.  Neurological:  Negative for loss of consciousness and headaches.    Physical Exam Updated Vital Signs BP 126/77 (BP  Location: Left Arm)   Pulse 68   Temp 98.6 F (37 C) (Oral)   Resp 18   LMP 07/20/2020   SpO2 100%  Physical Exam Vitals and nursing note reviewed.  Constitutional:      General: She is not in acute distress.    Appearance: She is well-developed. She is not ill-appearing.  HENT:     Head: Normocephalic and atraumatic.     Nose: Nose normal.     Mouth/Throat:     Mouth: Mucous membranes are moist.  Eyes:     Extraocular Movements: Extraocular movements intact.      Conjunctiva/sclera: Conjunctivae normal.     Pupils: Pupils are equal, round, and reactive to light.  Cardiovascular:     Rate and Rhythm: Normal rate and regular rhythm.     Pulses: Normal pulses.     Heart sounds: Normal heart sounds. No murmur heard. Pulmonary:     Effort: Pulmonary effort is normal. No respiratory distress.     Breath sounds: Normal breath sounds.  Abdominal:     Palpations: Abdomen is soft.     Tenderness: There is no abdominal tenderness.  Musculoskeletal:        General: No swelling.     Cervical back: Normal range of motion and neck supple.  Skin:    General: Skin is warm and dry.     Capillary Refill: Capillary refill takes less than 2 seconds.  Neurological:     General: No focal deficit present.     Mental Status: She is alert.  Psychiatric:        Mood and Affect: Mood normal.     ED Results / Procedures / Treatments   Labs (all labs ordered are listed, but only abnormal results are displayed) Labs Reviewed  CBC - Abnormal; Notable for the following components:      Result Value   Hemoglobin 11.7 (*)    MCV 73.8 (*)    MCH 23.5 (*)    RDW 15.7 (*)    All other components within normal limits  SARS CORONAVIRUS 2 BY RT PCR  BASIC METABOLIC PANEL  HCG, SERUM, QUALITATIVE  URINALYSIS, ROUTINE W REFLEX MICROSCOPIC  TROPONIN I (HIGH SENSITIVITY)    EKG EKG Interpretation  Date/Time:  Friday July 16 2022 14:49:20 EDT Ventricular Rate:  69 PR Interval:  164 QRS Duration: 84 QT Interval:  412 QTC Calculation: 441 R Axis:   70 Text Interpretation: Normal sinus rhythm with sinus arrhythmia Normal ECG No previous ECGs available Confirmed by Lennice Sites ([id number]) on 07/16/2022 4:36:55 PM  Radiology DG Chest 1 View  Result Date: 07/16/2022 CLINICAL DATA:  Sudden onset of vomiting, headache and dizziness. EXAM: CHEST  1 VIEW COMPARISON:  None Available. FINDINGS: Heart is normal in size.The cardiomediastinal contours are normal. The lungs  are clear. Pulmonary vasculature is normal. No consolidation, pleural effusion, or pneumothorax. No acute osseous abnormalities are seen. IMPRESSION: Negative frontal view of the chest. Electronically Signed   By: Keith Rake M.D.   On: 07/16/2022 15:33    Procedures Procedures    Medications Ordered in ED Medications - No data to display  ED Course/ Medical Decision Making/ A&P                           Medical Decision Making  Casey Sullivan is here with generalized weakness here the last day or 2.  Normal vitals.  No fever.  No significant  medical history.  History of migraines.  Not having any current headache.  Denies any ongoing symptoms.  No chest pain or shortness of breath.  She states she has been under a lot of stress.  She has had blood work done prior to my evaluation.  Differential diagnosis is likely viral process versus mild migraine.  I have no concern for stroke or heart attack or infectious process.  She is neurologically intact.  EKG done per my review and interpretation shows sinus rhythm.  No ischemic changes.  She has had CBC BMP, troponin, urinalysis, COVID test, chest x-ray done prior to my evaluation.  Per my review and interpretation of the labs there is no significant anemia, electrolyte abnormality, kidney injury.  Pregnancy test is negative.  COVID test is negative.  Chest x-ray with no evidence of pneumonia or pneumothorax.  Overall she is very well-appearing.  Recommend rest and hydration.  Discharged in good condition.  This chart was dictated using voice recognition software.  Despite best efforts to proofread,  errors can occur which can change the documentation meaning.         Final Clinical Impression(s) / ED Diagnoses Final diagnoses:  Weakness    Rx / DC Orders ED Discharge Orders     None         Lennice Sites, DO 07/16/22 [place], [place], DO 07/16/22 1808

## 2022-07-16 NOTE — ED Triage Notes (Signed)
Pt from UC for dizziness when walking x 2 days. Hx iron deficiency anemia and is sent here for further eval. Reports some n/v yesterday and intermittent headache.
